# Patient Record
Sex: Female | Born: 1945 | Race: White | Hispanic: No | Marital: Married | Smoking: Never smoker
Health system: Southern US, Community
[De-identification: ages and names within clinical notes are randomized; demographics above are authoritative.]

## PROBLEM LIST (undated history)

## (undated) DIAGNOSIS — Z860100 Personal history of colon polyps, unspecified: Secondary | ICD-10-CM

## (undated) DIAGNOSIS — S62609A Fracture of unspecified phalanx of unspecified finger, initial encounter for closed fracture: Secondary | ICD-10-CM

## (undated) DIAGNOSIS — E785 Hyperlipidemia, unspecified: Secondary | ICD-10-CM

## (undated) DIAGNOSIS — H8109 Meniere's disease, unspecified ear: Secondary | ICD-10-CM

## (undated) DIAGNOSIS — J309 Allergic rhinitis, unspecified: Secondary | ICD-10-CM

## (undated) DIAGNOSIS — F419 Anxiety disorder, unspecified: Secondary | ICD-10-CM

## (undated) HISTORY — DX: Personal history of colon polyps, unspecified: Z86.0100

## (undated) HISTORY — DX: Anxiety disorder, unspecified: F41.9

## (undated) HISTORY — DX: Allergic rhinitis, unspecified: J30.9

## (undated) HISTORY — DX: Personal history of colonic polyps: Z86.010

## (undated) HISTORY — DX: Meniere's disease, unspecified ear: H81.09

## (undated) HISTORY — DX: Hyperlipidemia, unspecified: E78.5

---
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
## 1998-07-26 ENCOUNTER — Other Ambulatory Visit: Admission: RE | Admit: 1998-07-26 | Discharge: 1998-07-26 | Payer: Self-pay | Admitting: Obstetrics & Gynecology

## 1999-08-02 ENCOUNTER — Other Ambulatory Visit: Admission: RE | Admit: 1999-08-02 | Discharge: 1999-08-02 | Payer: Self-pay | Admitting: Obstetrics & Gynecology

## 1999-12-25 ENCOUNTER — Encounter: Payer: Self-pay | Admitting: Obstetrics & Gynecology

## 1999-12-25 ENCOUNTER — Encounter: Admission: RE | Admit: 1999-12-25 | Discharge: 1999-12-25 | Payer: Self-pay | Admitting: Obstetrics & Gynecology

## 2000-07-08 ENCOUNTER — Encounter: Payer: Self-pay | Admitting: Obstetrics & Gynecology

## 2000-07-08 ENCOUNTER — Encounter: Admission: RE | Admit: 2000-07-08 | Discharge: 2000-07-08 | Payer: Self-pay | Admitting: Obstetrics & Gynecology

## 2000-08-22 ENCOUNTER — Other Ambulatory Visit: Admission: RE | Admit: 2000-08-22 | Discharge: 2000-08-22 | Payer: Self-pay | Admitting: Obstetrics & Gynecology

## 2001-07-09 ENCOUNTER — Encounter: Admission: RE | Admit: 2001-07-09 | Discharge: 2001-07-09 | Payer: Self-pay | Admitting: Obstetrics & Gynecology

## 2001-07-09 ENCOUNTER — Encounter: Payer: Self-pay | Admitting: Obstetrics & Gynecology

## 2001-10-03 ENCOUNTER — Other Ambulatory Visit: Admission: RE | Admit: 2001-10-03 | Discharge: 2001-10-03 | Payer: Self-pay | Admitting: Obstetrics & Gynecology

## 2002-10-30 ENCOUNTER — Encounter: Payer: Self-pay | Admitting: Obstetrics & Gynecology

## 2002-10-30 ENCOUNTER — Encounter: Admission: RE | Admit: 2002-10-30 | Discharge: 2002-10-30 | Payer: Self-pay | Admitting: Obstetrics & Gynecology

## 2002-11-19 ENCOUNTER — Other Ambulatory Visit: Admission: RE | Admit: 2002-11-19 | Discharge: 2002-11-19 | Payer: Self-pay | Admitting: Obstetrics & Gynecology

## 2003-12-22 ENCOUNTER — Encounter: Admission: RE | Admit: 2003-12-22 | Discharge: 2003-12-22 | Payer: Self-pay | Admitting: Obstetrics & Gynecology

## 2004-01-18 ENCOUNTER — Other Ambulatory Visit: Admission: RE | Admit: 2004-01-18 | Discharge: 2004-01-18 | Payer: Self-pay | Admitting: Obstetrics & Gynecology

## 2005-01-25 ENCOUNTER — Encounter: Admission: RE | Admit: 2005-01-25 | Discharge: 2005-01-25 | Payer: Self-pay | Admitting: Obstetrics & Gynecology

## 2005-01-31 ENCOUNTER — Other Ambulatory Visit: Admission: RE | Admit: 2005-01-31 | Discharge: 2005-01-31 | Payer: Self-pay | Admitting: Obstetrics & Gynecology

## 2006-02-05 ENCOUNTER — Encounter: Admission: RE | Admit: 2006-02-05 | Discharge: 2006-02-05 | Payer: Self-pay | Admitting: Obstetrics & Gynecology

## 2006-02-13 ENCOUNTER — Other Ambulatory Visit: Admission: RE | Admit: 2006-02-13 | Discharge: 2006-02-13 | Payer: Self-pay | Admitting: Obstetrics & Gynecology

## 2007-02-10 ENCOUNTER — Encounter: Admission: RE | Admit: 2007-02-10 | Discharge: 2007-02-10 | Payer: Self-pay | Admitting: Obstetrics & Gynecology

## 2008-02-11 ENCOUNTER — Encounter: Admission: RE | Admit: 2008-02-11 | Discharge: 2008-02-11 | Payer: Self-pay | Admitting: Obstetrics & Gynecology

## 2009-02-11 ENCOUNTER — Encounter: Admission: RE | Admit: 2009-02-11 | Discharge: 2009-02-11 | Payer: Self-pay | Admitting: Obstetrics & Gynecology

## 2009-03-21 ENCOUNTER — Ambulatory Visit: Payer: Self-pay | Admitting: Internal Medicine

## 2009-03-21 DIAGNOSIS — Z9189 Other specified personal risk factors, not elsewhere classified: Secondary | ICD-10-CM | POA: Insufficient documentation

## 2009-03-21 DIAGNOSIS — Z860100 Personal history of colon polyps, unspecified: Secondary | ICD-10-CM | POA: Insufficient documentation

## 2009-03-21 DIAGNOSIS — E785 Hyperlipidemia, unspecified: Secondary | ICD-10-CM | POA: Insufficient documentation

## 2009-03-21 DIAGNOSIS — H8109 Meniere's disease, unspecified ear: Secondary | ICD-10-CM | POA: Insufficient documentation

## 2009-03-21 DIAGNOSIS — J309 Allergic rhinitis, unspecified: Secondary | ICD-10-CM | POA: Insufficient documentation

## 2009-03-21 LAB — CONVERTED CEMR LAB
Albumin: 3.7 g/dL (ref 3.5–5.2)
Cholesterol: 173 mg/dL (ref 0–200)
HDL: 66.4 mg/dL (ref >39.00)
LDL Cholesterol: 94 mg/dL (ref 0–99)
Total CHOL/HDL Ratio: 3
Triglycerides: 64 mg/dL (ref 0.0–149.0)
VLDL: 12.8 mg/dL (ref 0.0–40.0)

## 2009-09-26 ENCOUNTER — Encounter: Payer: Self-pay | Admitting: Internal Medicine

## 2010-02-17 ENCOUNTER — Ambulatory Visit: Payer: Self-pay | Admitting: Internal Medicine

## 2010-02-19 LAB — CONVERTED CEMR LAB
ALT: 24 units/L (ref 0–35)
Cholesterol: 176 mg/dL (ref 0–200)
Total Bilirubin: 1.3 mg/dL — ABNORMAL HIGH (ref 0.3–1.2)
Total CHOL/HDL Ratio: 2
Triglycerides: 67 mg/dL (ref 0.0–149.0)

## 2010-02-22 ENCOUNTER — Ambulatory Visit: Payer: Self-pay | Admitting: Internal Medicine

## 2010-02-28 ENCOUNTER — Encounter: Admission: RE | Admit: 2010-02-28 | Discharge: 2010-02-28 | Payer: Self-pay | Admitting: Obstetrics & Gynecology

## 2010-10-05 ENCOUNTER — Encounter: Payer: Self-pay | Admitting: Internal Medicine

## 2011-01-09 NOTE — Miscellaneous (Signed)
Summary: Flu Vaccination/[facility]  Flu Vaccination/[facility]   Imported By: Sherian Rein 10/12/2010 11:06:17  _____________________________________________________________________  External Attachment:    Type:   Image     Comment:   External Document

## 2011-01-09 NOTE — Assessment & Plan Note (Signed)
Summary: Jasmine Pham   Vital Signs:  Patient profile:   65 year old female Height:      5.1 inches (12.95 cm) Weight:      108.8 pounds (49.45 kg) BMI:     2951.61 O2 Sat:      97 % on Room air Temp:     98.1 degrees F (36.72 degrees C) oral Pulse rate:   58 / minute BP sitting:   110 / 58  (left arm) Cuff size:   regular  Vitals Entered By: Orlan Leavens (February 22, 2010 11:24 AM)  O2 Flow:  Room air CC: follow-up visit/ need refill on crestor Is Patient Diabetic? No Pain Assessment Patient in pain? no        Primary Care Provider:  Newt Lukes MD  CC:  follow-up visit/ need refill on crestor.  History of Present Illness: here for followup  1) dyslipidemia - reports compliance with ongoing medical treatment and no changes in medication dose or frequency. denies adverse side effects related to current therapy.     Clinical Review Panels:  Prevention   Last Mammogram:  normal (02/11/2009)   Last Pap Smear:  normal (03/26/2008)   Last Colonoscopy:  normal (12/11/2007)  Lipid Management   Cholesterol:  176 (02/17/2010)   LDL (bad choesterol):  88 (02/17/2010)   HDL (good cholesterol):  74.20 (02/17/2010)  Complete Metabolic Panel   Albumin:  3.7 (02/17/2010)   Total Protein:  6.1 (02/17/2010)   Total Bili:  1.3 (02/17/2010)   Alk Phos:  70 (02/17/2010)   SGPT (ALT):  24 (02/17/2010)   SGOT (AST):  32 (02/17/2010)   Current Medications (verified): 1)  Crestor 5 Mg Tabs (Rosuvastatin Calcium) .... Take 1 By Mouth Qd 2)  Evista 60 Mg Tabs (Raloxifene Hcl) .... Take 1 By Mouth Qd 3)  Chlorthalidone 25 Mg Tabs (Chlorthalidone) .... Take 1 By Mouth Qd 4)  Calcium 600/vitamin D 600-400 Mg-Unit Tabs (Calcium Carbonate-Vitamin D) .... Take 1 By Mouth Qd 5)  Potassium 99 Mg Tabs (Potassium) .... Take 1 By Mouth Qd  Allergies (verified): No Known Drug Allergies  Review of Systems  The patient denies anorexia, weight loss, and chest pain.    Physical  Exam  General:  alert, well-developed, well-nourished, and cooperative to examination.    Lungs:  normal respiratory effort, no intercostal retractions or use of accessory muscles; normal breath sounds bilaterally - no crackles and no wheezes.    Heart:  normal rate, regular rhythm, no murmur, and no rub. BLE without edema.   Impression & Recommendations:  Problem # 1:  HYPERLIPIDEMIA (ICD-272.4) cont same - tol med well -  Her updated medication list for this problem includes:    Crestor 5 Mg Tabs (Rosuvastatin calcium) .Marland Kitchen... Take 1 by mouth qd  Labs Reviewed: SGOT: 32 (02/17/2010)   SGPT: 24 (02/17/2010)   HDL:74.20 (02/17/2010), 66.40 (03/21/2009)  LDL:88 (02/17/2010), 94 (03/21/2009)  Chol:176 (02/17/2010), 173 (03/21/2009)  Trig:67.0 (02/17/2010), 64.0 (03/21/2009)  Complete Medication List: 1)  Crestor 5 Mg Tabs (Rosuvastatin calcium) .... Take 1 by mouth qd 2)  Evista 60 Mg Tabs (Raloxifene hcl) .... Take 1 by mouth qd 3)  Chlorthalidone 25 Mg Tabs (Chlorthalidone) .... Take 1 by mouth qd 4)  Calcium 600/vitamin D 600-400 Mg-unit Tabs (Calcium carbonate-vitamin d) .... Take 1 by mouth qd 5)  Potassium 99 Mg Tabs (Potassium) .... Take 1 by mouth qd  Patient Instructions: 1)  it was good to see you today. 2)  labs and exam look good - continue same - no change 3)  Please schedule a follow-up appointment in 6-12 months, sooner if problems.  Prescriptions: CRESTOR 5 MG TABS (ROSUVASTATIN CALCIUM) TAKE 1 by mouth QD  #30 x 5   Entered by:   Orlan Leavens   Authorized by:   Newt Lukes MD   Signed by:   Orlan Leavens on 02/22/2010   Method used:   Electronically to        [phone] (retail)       [address] [place], [place]  [address]       Ph: [phone] or [phone]       Fax: [phone]   RxID:   [id number]

## 2011-02-12 ENCOUNTER — Other Ambulatory Visit: Payer: Self-pay | Admitting: Obstetrics & Gynecology

## 2011-02-12 DIAGNOSIS — Z1231 Encounter for screening mammogram for malignant neoplasm of breast: Secondary | ICD-10-CM

## 2011-03-07 ENCOUNTER — Ambulatory Visit
Admission: RE | Admit: 2011-03-07 | Discharge: 2011-03-07 | Disposition: A | Discharge status: Home / Self Care | Payer: BC Managed Care – PPO | Source: Ambulatory Visit | Attending: Obstetrics & Gynecology | Admitting: Obstetrics & Gynecology

## 2011-03-07 DIAGNOSIS — Z1231 Encounter for screening mammogram for malignant neoplasm of breast: Secondary | ICD-10-CM

## 2011-03-28 ENCOUNTER — Other Ambulatory Visit (INDEPENDENT_AMBULATORY_CARE_PROVIDER_SITE_OTHER): Payer: BC Managed Care – PPO | Admitting: Internal Medicine

## 2011-03-28 ENCOUNTER — Other Ambulatory Visit (INDEPENDENT_AMBULATORY_CARE_PROVIDER_SITE_OTHER): Payer: BC Managed Care – PPO

## 2011-03-28 ENCOUNTER — Encounter: Payer: Self-pay | Admitting: Internal Medicine

## 2011-03-28 ENCOUNTER — Other Ambulatory Visit: Payer: Self-pay | Admitting: Internal Medicine

## 2011-03-28 DIAGNOSIS — Z0000 Encounter for general adult medical examination without abnormal findings: Secondary | ICD-10-CM

## 2011-03-28 DIAGNOSIS — E785 Hyperlipidemia, unspecified: Secondary | ICD-10-CM

## 2011-03-28 LAB — CBC WITH DIFFERENTIAL/PLATELET
Basophils Absolute: 0 10*3/uL (ref 0.0–0.1)
Basophils Relative: 0.3 % (ref 0.0–3.0)
Eosinophils Absolute: 0 10*3/uL (ref 0.0–0.7)
Eosinophils Relative: 0.1 % (ref 0.0–5.0)
Lymphocytes Relative: 25 % (ref 12.0–46.0)
Lymphs Abs: 1.2 10*3/uL (ref 0.7–4.0)
MCHC: 35.4 g/dL (ref 30.0–36.0)
Neutro Abs: 3.3 10*3/uL (ref 1.4–7.7)
RBC: 4.42 Mil/uL (ref 3.87–5.11)
RDW: 12.9 % (ref 11.5–14.6)
WBC: 4.8 10*3/uL (ref 4.5–10.5)

## 2011-03-28 LAB — URINALYSIS, ROUTINE W REFLEX MICROSCOPIC
Bilirubin Urine: NEGATIVE
Nitrite: NEGATIVE
Specific Gravity, Urine: 1.01 (ref 1.000–1.030)
Total Protein, Urine: NEGATIVE
Urine Glucose: NEGATIVE
Urobilinogen, UA: 0.2 (ref 0.0–1.0)
pH: 6.5 (ref 5.0–8.0)

## 2011-03-28 LAB — LIPID PANEL
HDL: 81 mg/dL (ref >39.00)
Total CHOL/HDL Ratio: 3
VLDL: 9.8 mg/dL (ref 0.0–40.0)

## 2011-03-28 LAB — HEPATIC FUNCTION PANEL: AST: 27 U/L (ref 0–37)

## 2011-03-28 LAB — BASIC METABOLIC PANEL
BUN: 13 mg/dL (ref 6–23)
CO2: 33 mEq/L — ABNORMAL HIGH (ref 19–32)
Calcium: 9.2 mg/dL (ref 8.4–10.5)
Chloride: 103 mEq/L (ref 96–112)
Sodium: 142 mEq/L (ref 135–145)

## 2011-03-28 LAB — LDL CHOLESTEROL, DIRECT: Direct LDL: 103.8 mg/dL

## 2011-04-04 ENCOUNTER — Encounter: Payer: Self-pay | Admitting: Internal Medicine

## 2011-04-04 ENCOUNTER — Ambulatory Visit (INDEPENDENT_AMBULATORY_CARE_PROVIDER_SITE_OTHER): Payer: BC Managed Care – PPO | Admitting: Internal Medicine

## 2011-04-04 VITALS — BP 132/70 | HR 78 | Temp 98.8°F | Ht 61.0 in | Wt 105.1 lb

## 2011-04-04 DIAGNOSIS — G2581 Restless legs syndrome: Secondary | ICD-10-CM

## 2011-04-04 DIAGNOSIS — Z0000 Encounter for general adult medical examination without abnormal findings: Secondary | ICD-10-CM

## 2011-04-04 DIAGNOSIS — E785 Hyperlipidemia, unspecified: Secondary | ICD-10-CM

## 2011-04-04 MED ORDER — FERROUS SULFATE 325 (65 FE) MG PO TABS: 325.0000 mg | ORAL_TABLET | Freq: Every day | ORAL | Status: DC

## 2011-04-04 NOTE — Progress Notes (Signed)
Subjective:    Patient ID: Jasmine Pham, female    DOB: [birth year], 65 y.o.   MRN: [id number]  HPI patient is here today for annual physical. Patient feels well and has no complaints.  Also reviewed chronic medical issues: Dyslipidemia - the patient reports compliance with medication(s) as prescribed. Denies adverse side effects. RLS - progressive symptoms - ?med options for same Allergic rhinitis - takes otc meds as needed - symptoms worse at night and spring season  Past Medical History  Diagnosis Date  . UNSPECIFIED MENIERES DISEASE   . COLONIC POLYPS, HX OF   . HYPERLIPIDEMIA   . ALLERGIC RHINITIS    Family History  Problem Relation Age of Onset  . Prostate cancer Father   . Colon cancer Other   . Hyperlipidemia Other   . Kidney disease Other    History  Substance Use Topics  . Smoking status: Never Smoker   . Smokeless tobacco: Not on file  . Alcohol Use: No    Review of Systems  Constitutional: Negative for fever.  Respiratory: Negative for cough and shortness of breath.   Cardiovascular: Negative for chest pain.  Gastrointestinal: Negative for abdominal pain.  Musculoskeletal: Negative for gait problem.  Skin: Negative for rash.  Neurological: Negative for dizziness.  No other specific complaints in a complete review of systems (except as listed in HPI above).     Objective:   Physical Exam BP 132/70  Pulse 78  Temp(Src) 98.8 F (37.1 C) (Oral)  Ht 5\' 1"  (1.549 m)  Wt 105 lb 1.9 oz (47.682 kg)  BMI 19.86 kg/m2  SpO2 97% Physical Exam  Constitutional: She is oriented to person, place, and time. She appears well-developed and well-nourished. No distress.  HENT:Head: Normocephalic and atraumatic. Ears - B TMs clear without effusion or erythema. Nose: Nose normal.  Mouth/Throat: Oropharynx is clear and moist. No oropharyngeal exudate.  Eyes: Conjunctivae and EOM are normal. Pupils are equal, round, and reactive to light. No scleral icterus.  Neck:  Normal range of motion. Neck supple. No JVD present. No thyromegaly present.  Cardiovascular: Normal rate, regular rhythm and normal heart sounds.  No murmur heard. Pulmonary/Chest: Effort normal and breath sounds normal. No respiratory distress. She has no wheezes.  Abdominal: Soft. Bowel sounds are normal. She exhibits no distension. There is no tenderness.  Musculoskeletal: Normal range of motion. She exhibits no edema.  Neurological: She is alert and oriented to person, place, and time. No cranial nerve deficit. Coordination normal.  Skin: Skin is warm and dry. No rash noted. No erythema.  Psychiatric: She has a normal mood and affect. Her behavior is normal. Judgment and thought content normal.  GU - defer to gynecology     Lab Results  Component Value Date   WBC 4.8 03/28/2011   HGB 14.2 03/28/2011   HCT 40.2 03/28/2011   PLT 236.0 03/28/2011   CHOL 204* 03/28/2011   TRIG 49.0 03/28/2011   HDL 81.00 03/28/2011   LDLDIRECT 103.8 03/28/2011   ALT 17 03/28/2011   AST 27 03/28/2011   NA 142 03/28/2011   K 4.2 03/28/2011   CL 103 03/28/2011   CREATININE 0.7 03/28/2011   BUN 13 03/28/2011   CO2 33* 03/28/2011   TSH 2.37 03/28/2011    Assessment & Plan:   CPX - Patient has been counseled on age-appropriate routine health concerns for screening and prevention. These are reviewed and up-to-date. Immunizations are up-to-date or declined. Labs and ECG reviewed (NSR, no ischemia; no  arrythmia)

## 2011-04-04 NOTE — Patient Instructions (Signed)
It was good to see you today. Labs, exam look good today - keep up the good work! Try iron for restless legs, avoid caffiene and alcohol before bedtime - call if symptoms persist and will try sinemet if needed Refill on medication(s) as discussed today. Please schedule followup in 12 months for labs and physical, call sooner if problems.

## 2011-04-04 NOTE — Assessment & Plan Note (Signed)
Pt will try 30days iron supplement for same - if unimproved, consider sinemet (pt will call if rx needed)

## 2011-04-04 NOTE — Assessment & Plan Note (Signed)
On statin. The current medical regimen is effective;  continue present plan and medications. Refills today

## 2011-08-05 ENCOUNTER — Other Ambulatory Visit: Payer: Self-pay | Admitting: Internal Medicine

## 2011-10-05 ENCOUNTER — Encounter: Payer: Self-pay | Admitting: Internal Medicine

## 2012-01-30 ENCOUNTER — Other Ambulatory Visit: Payer: Self-pay | Admitting: Internal Medicine

## 2012-01-30 DIAGNOSIS — Z1231 Encounter for screening mammogram for malignant neoplasm of breast: Secondary | ICD-10-CM

## 2012-03-11 ENCOUNTER — Ambulatory Visit: Payer: BC Managed Care – PPO

## 2012-03-18 ENCOUNTER — Ambulatory Visit: Payer: BC Managed Care – PPO

## 2012-03-19 ENCOUNTER — Ambulatory Visit
Admission: RE | Admit: 2012-03-19 | Discharge: 2012-03-19 | Disposition: A | Discharge status: Home / Self Care | Payer: BC Managed Care – PPO | Source: Ambulatory Visit | Attending: Internal Medicine | Admitting: Internal Medicine

## 2012-03-19 DIAGNOSIS — Z1231 Encounter for screening mammogram for malignant neoplasm of breast: Secondary | ICD-10-CM

## 2012-04-16 ENCOUNTER — Telehealth: Payer: Self-pay | Admitting: *Deleted

## 2012-04-16 DIAGNOSIS — Z0000 Encounter for general adult medical examination without abnormal findings: Secondary | ICD-10-CM

## 2012-04-16 NOTE — Telephone Encounter (Signed)
Message copied by Deatra James on Wed Apr 16, 2012  4:56 PM ------      Message from: Etheleen Sia      Created: Wed Apr 16, 2012  4:21 PM      Regarding: labs       PHYSICAL LABS FOR June 19

## 2012-04-16 NOTE — Telephone Encounter (Signed)
Received staff msg made cpx for 05/18/12 need labs entered... 04/16/12@4 :57pm/LMB

## 2012-05-21 ENCOUNTER — Other Ambulatory Visit (INDEPENDENT_AMBULATORY_CARE_PROVIDER_SITE_OTHER): Payer: BC Managed Care – PPO

## 2012-05-21 DIAGNOSIS — Z0000 Encounter for general adult medical examination without abnormal findings: Secondary | ICD-10-CM

## 2012-05-21 LAB — CBC WITH DIFFERENTIAL/PLATELET
Basophils Absolute: 0 10*3/uL (ref 0.0–0.1)
MCHC: 33.7 g/dL (ref 30.0–36.0)
MCV: 92.4 fl (ref 78.0–100.0)
RBC: 4.53 Mil/uL (ref 3.87–5.11)
RDW: 13.4 % (ref 11.5–14.6)
WBC: 3.3 10*3/uL — ABNORMAL LOW (ref 4.5–10.5)

## 2012-05-21 LAB — URINALYSIS, ROUTINE W REFLEX MICROSCOPIC
Bilirubin Urine: NEGATIVE
Specific Gravity, Urine: 1.025 (ref 1.000–1.030)
Total Protein, Urine: NEGATIVE
Urine Glucose: NEGATIVE
Urobilinogen, UA: 0.2 (ref 0.0–1.0)
pH: 5.5 (ref 5.0–8.0)

## 2012-05-21 LAB — BASIC METABOLIC PANEL
CO2: 32 mEq/L (ref 19–32)
Glucose, Bld: 70 mg/dL (ref 70–99)
Potassium: 4.4 mEq/L (ref 3.5–5.1)
Sodium: 141 mEq/L (ref 135–145)

## 2012-05-21 LAB — HEPATIC FUNCTION PANEL
AST: 21 U/L (ref 0–37)
Alkaline Phosphatase: 75 U/L (ref 39–117)
Total Bilirubin: 1 mg/dL (ref 0.3–1.2)

## 2012-05-21 LAB — LIPID PANEL: HDL: 74.1 mg/dL (ref >39.00)

## 2012-05-28 ENCOUNTER — Encounter: Payer: Self-pay | Admitting: Internal Medicine

## 2012-05-28 ENCOUNTER — Ambulatory Visit (INDEPENDENT_AMBULATORY_CARE_PROVIDER_SITE_OTHER): Payer: BC Managed Care – PPO | Admitting: Internal Medicine

## 2012-05-28 VITALS — BP 120/78 | HR 56 | Temp 98.1°F | Ht 61.0 in | Wt 107.0 lb

## 2012-05-28 DIAGNOSIS — E785 Hyperlipidemia, unspecified: Secondary | ICD-10-CM

## 2012-05-28 DIAGNOSIS — T753XXA Motion sickness, initial encounter: Secondary | ICD-10-CM

## 2012-05-28 DIAGNOSIS — Z0000 Encounter for general adult medical examination without abnormal findings: Secondary | ICD-10-CM

## 2012-05-28 MED ORDER — SCOPOLAMINE 1 MG/3DAYS TD PT72: 1.0000 | MEDICATED_PATCH | TRANSDERMAL | Status: DC

## 2012-05-28 MED ORDER — ROSUVASTATIN CALCIUM 5 MG PO TABS: 5.0000 mg | ORAL_TABLET | Freq: Every day | ORAL | Status: DC

## 2012-05-28 NOTE — Assessment & Plan Note (Signed)
On statin. There have been some probable medication and dietary compliance issues here. I have discussed with her the great importance of following the treatment plan exactly as directed in order to achieve a good medical outcome. The current medical regimen is effective;  continue present plan and medications. Refills today

## 2012-05-28 NOTE — Patient Instructions (Addendum)
It was good to see you today. We have reviewed your prior records including labs and tests today Health Maintenance reviewed - all recommended immunizations and age-appropriate screenings are up-to-date. Medications reviewed, use transderm patch for motion sickness as needed - no other changes at this time. Refill on medication(s) as discussed today. Please schedule followup in 1 year for physical/labs, call sooner if problems.  Preventive Care for Adults, Female A healthy lifestyle and preventive care can promote health and wellness. Preventive health guidelines for women include the following key practices.  A routine yearly physical is a good way to check with your caregiver about your health and preventive screening. It is a chance to share any concerns and updates on your health, and to receive a thorough exam.   Visit your dentist for a routine exam and preventive care every 6 months. Brush your teeth twice a day and floss once a day. Good oral hygiene prevents tooth decay and gum disease.   The frequency of eye exams is based on your age, health, family medical history, use of contact lenses, and other factors. Follow your caregiver's recommendations for frequency of eye exams.   Eat a healthy diet. Foods like vegetables, fruits, whole grains, low-fat dairy products, and lean protein foods contain the nutrients you need without too many calories. Decrease your intake of foods high in solid fats, added sugars, and salt. Eat the right amount of calories for you. Get information about a proper diet from your caregiver, if necessary.   Regular physical exercise is one of the most important things you can do for your health. Most adults should get at least 150 minutes of moderate-intensity exercise (any activity that increases your heart rate and causes you to sweat) each week. In addition, most adults need muscle-strengthening exercises on 2 or more days a week.   Maintain a healthy weight.  The body mass index (BMI) is a screening tool to identify possible weight problems. It provides an estimate of body fat based on height and weight. Your caregiver can help determine your BMI, and can help you achieve or maintain a healthy weight. For adults 20 years and older:   A BMI below 18.5 is considered underweight.   A BMI of 18.5 to 24.9 is normal.   A BMI of 25 to 29.9 is considered overweight.   A BMI of 30 and above is considered obese.   Maintain normal blood lipids and cholesterol levels by exercising and minimizing your intake of saturated fat. Eat a balanced diet with plenty of fruit and vegetables. Blood tests for lipids and cholesterol should begin at age 17 and be repeated every 5 years. If your lipid or cholesterol levels are high, you are over 50, or you are at high risk for heart disease, you may need your cholesterol levels checked more frequently. Ongoing high lipid and cholesterol levels should be treated with medicines if diet and exercise are not effective.   If you smoke, find out from your caregiver how to quit. If you do not use tobacco, do not start.   If you are pregnant, do not drink alcohol. If you are breastfeeding, be very cautious about drinking alcohol. If you are not pregnant and choose to drink alcohol, do not exceed 1 drink per day. One drink is considered to be 12 ounces (355 mL) of beer, 5 ounces (148 mL) of wine, or 1.5 ounces (44 mL) of liquor.   Avoid use of street drugs. Do not share needles  with anyone. Ask for help if you need support or instructions about stopping the use of drugs.   High blood pressure causes heart disease and increases the risk of stroke. Your blood pressure should be checked at least every 1 to 2 years. Ongoing high blood pressure should be treated with medicines if weight loss and exercise are not effective.   If you are 76 to 64 years old, ask your caregiver if you should take aspirin to prevent strokes.   Diabetes screening  involves taking a blood sample to check your fasting blood sugar level. This should be done once every 3 years, after age 28, if you are within normal weight and without risk factors for diabetes. Testing should be considered at a younger age or be carried out more frequently if you are overweight and have at least 1 risk factor for diabetes.   Breast cancer screening is essential preventive care for women. You should practice "breast self-awareness." This means understanding the normal appearance and feel of your breasts and may include breast self-examination. Any changes detected, no matter how small, should be reported to a caregiver. Women in their 37s and 30s should have a clinical breast exam (CBE) by a caregiver as part of a regular health exam every 1 to 3 years. After age 49, women should have a CBE every year. Starting at age 19, women should consider having a mammography (breast X-ray test) every year. Women who have a family history of breast cancer should talk to their caregiver about genetic screening. Women at a high risk of breast cancer should talk to their caregivers about having magnetic resonance imaging (MRI) and a mammography every year.   The Pap test is a screening test for cervical cancer. A Pap test can show cell changes on the cervix that might become cervical cancer if left untreated. A Pap test is a procedure in which cells are obtained and examined from the lower end of the uterus (cervix).   Women should have a Pap test starting at age 63.   Between ages 60 and 42, Pap tests should be repeated every 2 years.   Beginning at age 53, you should have a Pap test every 3 years as long as the past 3 Pap tests have been normal.   Some women have medical problems that increase the chance of getting cervical cancer. Talk to your caregiver about these problems. It is especially important to talk to your caregiver if a new problem develops soon after your last Pap test. In these  cases, your caregiver may recommend more frequent screening and Pap tests.   The above recommendations are the same for women who have or have not gotten the vaccine for human papillomavirus (HPV).   If you had a hysterectomy for a problem that was not cancer or a condition that could lead to cancer, then you no longer need Pap tests. Even if you no longer need a Pap test, a regular exam is a good idea to make sure no other problems are starting.   If you are between ages 17 and 104, and you have had normal Pap tests going back 10 years, you no longer need Pap tests. Even if you no longer need a Pap test, a regular exam is a good idea to make sure no other problems are starting.   If you have had past treatment for cervical cancer or a condition that could lead to cancer, you need Pap tests and screening for  cancer for at least 20 years after your treatment.   If Pap tests have been discontinued, risk factors (such as a new sexual partner) need to be reassessed to determine if screening should be resumed.   The HPV test is an additional test that may be used for cervical cancer screening. The HPV test looks for the virus that can cause the cell changes on the cervix. The cells collected during the Pap test can be tested for HPV. The HPV test could be used to screen women aged 70 years and older, and should be used in women of any age who have unclear Pap test results. After the age of 28, women should have HPV testing at the same frequency as a Pap test.   Colorectal cancer can be detected and often prevented. Most routine colorectal cancer screening begins at the age of 75 and continues through age 10. However, your caregiver may recommend screening at an earlier age if you have risk factors for colon cancer. On a yearly basis, your caregiver may provide home test kits to check for hidden blood in the stool. Use of a small camera at the end of a tube, to directly examine the colon (sigmoidoscopy or  colonoscopy), can detect the earliest forms of colorectal cancer. Talk to your caregiver about this at age 55, when routine screening begins.  Direct examination of the colon should be repeated every 5 to 10 years through age 59, unless early forms of pre-cancerous polyps or small growths are found.   Hepatitis C blood testing is recommended for all people born from [birth year] through 1965 and any individual with known risks for hepatitis C.   Practice safe sex. Use condoms and avoid high-risk sexual practices to reduce the spread of sexually transmitted infections (STIs). STIs include gonorrhea, chlamydia, syphilis, trichomonas, herpes, HPV, and human immunodeficiency virus (HIV). Herpes, HIV, and HPV are viral illnesses that have no cure. They can result in disability, cancer, and death. Sexually active women aged 38 and younger should be checked for chlamydia. Older women with new or multiple partners should also be tested for chlamydia. Testing for other STIs is recommended if you are sexually active and at increased risk.   Osteoporosis is a disease in which the bones lose minerals and strength with aging. This can result in serious bone fractures. The risk of osteoporosis can be identified using a bone density scan. Women ages 78 and over and women at risk for fractures or osteoporosis should discuss screening with their caregivers. Ask your caregiver whether you should take a calcium supplement or vitamin D to reduce the rate of osteoporosis.   Menopause can be associated with physical symptoms and risks. Hormone replacement therapy is available to decrease symptoms and risks. You should talk to your caregiver about whether hormone replacement therapy is right for you.   Use sunscreen with sun protection factor (SPF) of 30 or more. Apply sunscreen liberally and repeatedly throughout the day. You should seek shade when your shadow is shorter than you. Protect yourself by wearing long sleeves, pants, a  wide-brimmed hat, and sunglasses year round, whenever you are outdoors.   Once a month, do a whole body skin exam, using a mirror to look at the skin on your back. Notify your caregiver of new moles, moles that have irregular borders, moles that are larger than a pencil eraser, or moles that have changed in shape or color.   Stay current with required immunizations.   Influenza. You need  a dose every fall (or winter). The composition of the flu vaccine changes each year, so being vaccinated once is not enough.   Pneumococcal polysaccharide. You need 1 to 2 doses if you smoke cigarettes or if you have certain chronic medical conditions. You need 1 dose at age 77 (or older) if you have never been vaccinated.   Tetanus, diphtheria, pertussis (Tdap, Td). Get 1 dose of Tdap vaccine if you are younger than age 44, are over 80 and have contact with an infant, are a Research scientist (physical sciences), are pregnant, or simply want to be protected from whooping cough. After that, you need a Td booster dose every 10 years. Consult your caregiver if you have not had at least 3 tetanus and diphtheria-containing shots sometime in your life or have a deep or dirty wound.   HPV. You need this vaccine if you are a woman age 66 or younger. The vaccine is given in 3 doses over 6 months.   Measles, mumps, rubella (MMR). You need at least 1 dose of MMR if you were born in 1957 or later. You may also need a second dose.   Meningococcal. If you are age 40 to 28 and a first-year college student living in a residence hall, or have one of several medical conditions, you need to get vaccinated against meningococcal disease. You may also need additional booster doses.   Zoster (shingles). If you are age 71 or older, you should get this vaccine.   Varicella (chickenpox). If you have never had chickenpox or you were vaccinated but received only 1 dose, talk to your caregiver to find out if you need this vaccine.   Hepatitis A. You need this  vaccine if you have a specific risk factor for hepatitis A virus infection or you simply wish to be protected from this disease. The vaccine is usually given as 2 doses, 6 to 18 months apart.   Hepatitis B. You need this vaccine if you have a specific risk factor for hepatitis B virus infection or you simply wish to be protected from this disease. The vaccine is given in 3 doses, usually over 6 months.  Preventive Services / Frequency Ages 50 to 87  Blood pressure check.** / Every 1 to 2 years.   Lipid and cholesterol check.** / Every 5 years beginning at age 46.   Clinical breast exam.** / Every 3 years for women in their 61s and 30s.   Pap test.** / Every 2 years from ages 57 through 65. Every 3 years starting at age 33 through age 71 or 66 with a history of 3 consecutive normal Pap tests.   HPV screening.** / Every 3 years from ages 58 through ages 60 to 98 with a history of 3 consecutive normal Pap tests.   Hepatitis C blood test.** / For any individual with known risks for hepatitis C.   Skin self-exam. / Monthly.   Influenza immunization.** / Every year.   Pneumococcal polysaccharide immunization.** / 1 to 2 doses if you smoke cigarettes or if you have certain chronic medical conditions.   Tetanus, diphtheria, pertussis (Tdap, Td) immunization. / A one-time dose of Tdap vaccine. After that, you need a Td booster dose every 10 years.   HPV immunization. / 3 doses over 6 months, if you are 24 and younger.   Measles, mumps, rubella (MMR) immunization. / You need at least 1 dose of MMR if you were born in 1957 or later. You may also need a second  dose.   Meningococcal immunization. / 1 dose if you are age 20 to 68 and a first-year college student living in a residence hall, or have one of several medical conditions, you need to get vaccinated against meningococcal disease. You may also need additional booster doses.   Varicella immunization.** / Consult your caregiver.   Hepatitis  A immunization.** / Consult your caregiver. 2 doses, 6 to 18 months apart.   Hepatitis B immunization.** / Consult your caregiver. 3 doses usually over 6 months.  Ages 91 to 1  Blood pressure check.** / Every 1 to 2 years.   Lipid and cholesterol check.** / Every 5 years beginning at age 31.   Clinical breast exam.** / Every year after age 76.   Mammogram.** / Every year beginning at age 33 and continuing for as long as you are in good health. Consult with your caregiver.   Pap test.** / Every 3 years starting at age 58 through age 51 or 59 with a history of 3 consecutive normal Pap tests.   HPV screening.** / Every 3 years from ages 10 through ages 22 to 51 with a history of 3 consecutive normal Pap tests.   Fecal occult blood test (FOBT) of stool. / Every year beginning at age 12 and continuing until age 70. You may not need to do this test if you get a colonoscopy every 10 years.   Flexible sigmoidoscopy or colonoscopy.** / Every 5 years for a flexible sigmoidoscopy or every 10 years for a colonoscopy beginning at age 32 and continuing until age 76.   Hepatitis C blood test.** / For all people born from [birth year] through 1963 and any individual with known risks for hepatitis C.   Skin self-exam. / Monthly.   Influenza immunization.** / Every year.   Pneumococcal polysaccharide immunization.** / 1 to 2 doses if you smoke cigarettes or if you have certain chronic medical conditions.   Tetanus, diphtheria, pertussis (Tdap, Td) immunization.** / A one-time dose of Tdap vaccine. After that, you need a Td booster dose every 10 years.   Measles, mumps, rubella (MMR) immunization. / You need at least 1 dose of MMR if you were born in 1957 or later. You may also need a second dose.   Varicella immunization.** / Consult your caregiver.   Meningococcal immunization.** / Consult your caregiver.   Hepatitis A immunization.** / Consult your caregiver. 2 doses, 6 to 18 months apart.    Hepatitis B immunization.** / Consult your caregiver. 3 doses, usually over 6 months.  Ages 68 and over  Blood pressure check.** / Every 1 to 2 years.   Lipid and cholesterol check.** / Every 5 years beginning at age 47.   Clinical breast exam.** / Every year after age 67.   Mammogram.** / Every year beginning at age 5 and continuing for as long as you are in good health. Consult with your caregiver.   Pap test.** / Every 3 years starting at age 63 through age 43 or 16 with a 3 consecutive normal Pap tests. Testing can be stopped between 65 and 70 with 3 consecutive normal Pap tests and no abnormal Pap or HPV tests in the past 10 years.   HPV screening.** / Every 3 years from ages 72 through ages 56 or 38 with a history of 3 consecutive normal Pap tests. Testing can be stopped between 65 and 70 with 3 consecutive normal Pap tests and no abnormal Pap or HPV tests in the past 10 years.  Fecal occult blood test (FOBT) of stool. / Every year beginning at age 19 and continuing until age 20. You may not need to do this test if you get a colonoscopy every 10 years.   Flexible sigmoidoscopy or colonoscopy.** / Every 5 years for a flexible sigmoidoscopy or every 10 years for a colonoscopy beginning at age 70 and continuing until age 8.   Hepatitis C blood test.** / For all people born from [birth year] through 1963 and any individual with known risks for hepatitis C.   Osteoporosis screening.** / A one-time screening for women ages 49 and over and women at risk for fractures or osteoporosis.   Skin self-exam. / Monthly.   Influenza immunization.** / Every year.   Pneumococcal polysaccharide immunization.** / 1 dose at age 51 (or older) if you have never been vaccinated.   Tetanus, diphtheria, pertussis (Tdap, Td) immunization. / A one-time dose of Tdap vaccine if you are over 65 and have contact with an infant, are a Research scientist (physical sciences), or simply want to be protected from whooping cough. After  that, you need a Td booster dose every 10 years.   Varicella immunization.** / Consult your caregiver.   Meningococcal immunization.** / Consult your caregiver.   Hepatitis A immunization.** / Consult your caregiver. 2 doses, 6 to 18 months apart.   Hepatitis B immunization.** / Check with your caregiver. 3 doses, usually over 6 months.  ** Family history and personal history of risk and conditions may change your caregiver's recommendations. Document Released: 01/22/2002 Document Revised: 11/15/2011 Document Reviewed: 04/23/2011 [facility] Patient Information 2012 [place], [place].Motion Sickness Motion sickness is an unpleasant, temporary feeling of dizziness, nausea, and vomiting that occurs when a person is traveling. It can occur during travel by boat, car, airplane, or even on an amusement park ride. The symptoms of motion sickness usually get better once the motion or traveling stops, but problems may persist for hours or days. CAUSES   Motion of the body can cause fluid changes in your inner ear, which can result in motion sickness. Some people are more susceptible to motion sickness than others. Stress, other illnesses, or drinking too much alcohol may add to motion sickness. SYMPTOMS    Nausea.   Dizziness.   Unsteadiness when walking.   Vomiting.  DIAGNOSIS   Motion sickness is diagnosed based on your symptoms while you are traveling or moving. TREATMENT   Most people improve rapidly after the motion stops. There are also over-the-counter medicines that can help with motion sickness. Your caregiver can prescribe motion sickness patches. These patches are placed behind your ear. PREVENTION    Avoid situations that cause your motion sickness, if possible.   Consider taking medicines such as meclizine or dimenhydrinate before going on a trip that may cause motion sickness.   Do not eat large meals or drink alcohol before or during travel.   Take small, frequent sips of  liquids as needed.   Sit in an area of the airplane or boat with the least motion. On an airplane, sit near the wing. Lie back in your seat, if possible. When riding in a car, try to avoid sitting in the backseat.   Breathe slowly and deeply.   Do not read or focus on nearby objects, if possible, especially if the water or air is rough. However, watching the horizon or a distant object is sometimes helpful, especially in a boat.   Avoid areas where people are smoking, if possible.   Plan ahead for  vacations. Your caregiver can help you with a prescription or suggestions for over-the-counter medicines.  HOME CARE INSTRUCTIONS    Only take over-the-counter or prescription medicines as directed by your caregiver.   If you use a motion sickness patch, wash your hands after you put the patch on. Touching your hands to your eyes after using the patch can enlarge (dilate) your pupils for 1 to 2 days and disturb your vision.  SEEK MEDICAL CARE IF:    Your vomiting or nausea cannot be controlled with medicine and rest.   You notice blood in your vomit. This could be dark red or look like coffee grounds.   You faint or have severe dizziness or lightheadedness upon standing. These may be signs of dehydration.   You have a fever.   You have severe abdominal or chest pain.   You have trouble breathing.   You have a severe headache.   You develop weakness or numbness on one side of the body.   You have trouble speaking.  MAKE SURE YOU:    Understand these instructions.   Will watch your condition.   Will get help right away if you are not doing well or get worse.  Document Released: 11/26/2005 Document Revised: 11/15/2011 Document Reviewed: 06/26/2011 [facility] Patient Information 2012 [place], [place].

## 2012-05-28 NOTE — Progress Notes (Signed)
Subjective:    Patient ID: Jasmine Pham, female    DOB: [birth year], 66 y.o.   MRN: [id number]  HPI  patient is here today for annual physical. Patient feels well overall.  Also reviewed chronic medical issues: Dyslipidemia - the patient reports variable compliance with medication(s) as prescribed. Denies adverse side effects.  Allergic rhinitis - takes otc meds as needed - symptoms worse at night and spring season ? Med for motion sickness - hx same,planning whale watching boat trip next month   Past Medical History  Diagnosis Date  . UNSPECIFIED MENIERES DISEASE   . COLONIC POLYPS, HX OF   . HYPERLIPIDEMIA   . ALLERGIC RHINITIS   . Anxiety    Family History  Problem Relation Age of Onset  . Prostate cancer Father   . Colon cancer Other   . Hyperlipidemia Other   . Kidney disease Other    History  Substance Use Topics  . Smoking status: Never Smoker   . Smokeless tobacco: Not on file  . Alcohol Use: No    Review of Systems Constitutional: Negative for fever.  Respiratory: Negative for cough and shortness of breath.   Cardiovascular: Negative for chest pain.  Gastrointestinal: Negative for abdominal pain.  Musculoskeletal: Negative for gait problem.  Skin: Negative for rash.  Neurological: Negative for dizziness.  No other specific complaints in a complete review of systems (except as listed in HPI above).     Objective:   Physical Exam  BP 120/78  Pulse 56  Temp 98.1 F (36.7 C) (Oral)  Ht 5\' 1"  (1.549 m)  Wt 107 lb (48.535 kg)  BMI 20.22 kg/m2  SpO2 97%  Wt Readings from Last 3 Encounters:  05/28/12 107 lb (48.535 kg)  04/04/11 105 lb 1.9 oz (47.682 kg)  02/22/10 108 lb 12.8 oz (49.351 kg)   Constitutional: She appears well-developed and well-nourished. No distress.  HENT:Head: Normocephalic and atraumatic. Ears - B TMs clear without effusion or erythema. Nose: Nose normal. Mouth/Throat: Oropharynx is clear and moist. No oropharyngeal exudate.    Eyes: Conjunctivae and EOM are normal. Pupils are equal, round, and reactive to light. No scleral icterus.  Neck: Normal range of motion. Neck supple. No JVD present. No thyromegaly present.  Cardiovascular: Normal rate, regular rhythm and normal heart sounds.  No murmur heard. no BLE edema Pulmonary/Chest: Effort normal and breath sounds normal. No respiratory distress. She has no wheezes.  Abdominal: Soft. Bowel sounds are normal. She exhibits no distension. There is no tenderness.  Musculoskeletal: Normal range of motion. She exhibits no edema.  Neurological: She is alert and oriented to person, place, and time. No cranial nerve deficit. Coordination normal.  Skin: Skin is warm and dry. No rash noted. No erythema.  Psychiatric: She has a normal mood and affect. Her behavior is normal. Judgment and thought content normal.  GU - defer to gynecology     Lab Results  Component Value Date   WBC 3.3* 05/21/2012   HGB 14.1 05/21/2012   HCT 41.9 05/21/2012   PLT 232.0 05/21/2012   CHOL 217* 05/21/2012   TRIG 124.0 05/21/2012   HDL 74.10 05/21/2012   LDLDIRECT 125.5 05/21/2012   ALT 12 05/21/2012   AST 21 05/21/2012   NA 141 05/21/2012   K 4.4 05/21/2012   CL 103 05/21/2012   CREATININE 0.7 05/21/2012   BUN 17 05/21/2012   CO2 32 05/21/2012   TSH 2.12 05/21/2012   ECG: sinus brady @ 58 bpm, no ischemia -  unchanged Assessment & Plan:   CPX - Patient has been counseled on age-appropriate routine health concerns for screening and prevention. These are reviewed and up-to-date. Immunizations are up-to-date or declined. Labs and ECG reviewed (NSR, no ischemia; no arrythmia)  Motion sickness - transderm patch as needed

## 2012-06-27 ENCOUNTER — Ambulatory Visit: Payer: BC Managed Care – PPO | Admitting: Internal Medicine

## 2012-07-01 ENCOUNTER — Ambulatory Visit (INDEPENDENT_AMBULATORY_CARE_PROVIDER_SITE_OTHER): Payer: BC Managed Care – PPO | Admitting: Internal Medicine

## 2012-07-01 ENCOUNTER — Ambulatory Visit: Payer: BC Managed Care – PPO | Admitting: Internal Medicine

## 2012-07-01 ENCOUNTER — Encounter: Payer: Self-pay | Admitting: Internal Medicine

## 2012-07-01 ENCOUNTER — Ambulatory Visit (INDEPENDENT_AMBULATORY_CARE_PROVIDER_SITE_OTHER)
Admission: RE | Admit: 2012-07-01 | Discharge: 2012-07-01 | Disposition: A | Discharge status: Home / Self Care | Payer: BC Managed Care – PPO | Source: Ambulatory Visit | Attending: Internal Medicine | Admitting: Internal Medicine

## 2012-07-01 VITALS — BP 132/80 | HR 63 | Temp 97.3°F | Ht 61.0 in | Wt 108.6 lb

## 2012-07-01 DIAGNOSIS — M25473 Effusion, unspecified ankle: Secondary | ICD-10-CM

## 2012-07-01 DIAGNOSIS — M25579 Pain in unspecified ankle and joints of unspecified foot: Secondary | ICD-10-CM

## 2012-07-01 DIAGNOSIS — M25571 Pain in right ankle and joints of right foot: Secondary | ICD-10-CM

## 2012-07-01 DIAGNOSIS — M25471 Effusion, right ankle: Secondary | ICD-10-CM

## 2012-07-01 DIAGNOSIS — S93409A Sprain of unspecified ligament of unspecified ankle, initial encounter: Secondary | ICD-10-CM

## 2012-07-01 MED ORDER — POTASSIUM CHLORIDE ER 10 MEQ PO TBCR: 10.0000 meq | EXTENDED_RELEASE_TABLET | Freq: Every day | ORAL | Status: AC

## 2012-07-01 NOTE — Patient Instructions (Signed)
It was good to see you today. Test(s) ordered today. Your results will be called to you after review (48-72hours after test completion). If any changes need to be made, you will be notified at that time. Alternate between ibuprofen and tylenol for aches, pain and swelling symptoms as discussed Continue to ice your ankle and wear supportive shoes Ankle Sprain An ankle sprain is an injury to the strong, fibrous tissues (ligaments) that hold the bones of your ankle joint together.   CAUSES Ankle sprain usually is caused by a fall or by twisting your ankle. People who participate in sports are more prone to these types of injuries.   SYMPTOMS   Symptoms of ankle sprain include:  Pain in your ankle. The pain may be present at rest or only when you are trying to stand or walk.   Swelling.   Bruising. Bruising may develop immediately or within 1 to 2 days after your injury.   Difficulty standing or walking.  DIAGNOSIS   Your caregiver will ask you details about your injury and perform a physical exam of your ankle to determine if you have an ankle sprain. During the physical exam, your caregiver will press and squeeze specific areas of your foot and ankle. Your caregiver will try to move your ankle in certain ways. An X-ray exam may be done to be sure a bone was not broken or a ligament did not separate from one of the bones in your ankle (avulsion).   TREATMENT   Certain types of braces can help stabilize your ankle. Your caregiver can make a recommendation for this. Your caregiver may recommend the use of medication for pain. If your sprain is severe, your caregiver may refer you to a surgeon who helps to restore function to parts of your skeletal system (orthopedist) or a physical therapist. HOME CARE INSTRUCTIONS   Apply ice to your injury for 1 to 2 days or as directed by your caregiver. Applying ice helps to reduce inflammation and pain.  Put ice in a plastic bag.   Place a towel between  your skin and the bag.   Leave the ice on for 15 to 20 minutes at a time, every 2 hours while you are awake.   Take over-the-counter or prescription medicines for pain, discomfort, or fever only as directed by your caregiver.   Keep your injured leg elevated, when possible, to lessen swelling.   If your caregiver recommends crutches, use them as instructed. Gradually, put weight on the affected ankle. Continue to use crutches or a cane until you can walk without feeling pain in your ankle.   If you have a plaster splint, wear the splint as directed by your caregiver. Do not rest it on anything harder than a pillow the first 24 hours. Do not put weight on it. Do not get it wet. You may take it off to take a shower or bath.   You may have been given an elastic bandage to wear around your ankle to provide support. If the elastic bandage is too tight (you have numbness or tingling in your foot or your foot becomes cold and blue), adjust the bandage to make it comfortable.   If you have an air splint, you may blow more air into it or let air out to make it more comfortable. You may take your splint off at night and before taking a shower or bath.   Wiggle your toes in the splint several times per day if  you are able.  SEEK MEDICAL CARE IF:    You have an increase in bruising, swelling, or pain.   Your toes feel cold.   Pain relief is not achieved with medication.  SEEK IMMEDIATE MEDICAL CARE IF: Your toes are numb or blue or you have severe pain. MAKE SURE YOU:    Understand these instructions.   Will watch your condition.   Will get help right away if you are not doing well or get worse.  Document Released: 11/26/2005 Document Revised: 11/15/2011 Document Reviewed: 06/30/2008 [facility] Patient Information 2012 [place], [place].

## 2012-07-01 NOTE — Progress Notes (Signed)
  Subjective:    Patient ID: Jasmine Pham, female    DOB: March 14, 1946, 66 y.o.   MRN: [id number]  Injury The incident occurred more than 1 week ago (06/15/12). Incident location: hiking in [place]. The injury mechanism was a twisted joint. Context: hiking trial. There is an injury to the right ankle. The pain is mild. It is unlikely that a foreign body is present. Pertinent negatives include no headaches, inability to bear weight, loss of consciousness, neck pain, numbness, seizures, tingling or weakness. There have been no prior injuries to these areas.  Pain relieved with tylenol, but persisting swelling > 2 weeks. No bruising    Past Medical History  Diagnosis Date  . UNSPECIFIED MENIERES DISEASE   . COLONIC POLYPS, HX OF   . HYPERLIPIDEMIA   . ALLERGIC RHINITIS   . Anxiety      Review of Systems  Constitutional: Negative for fever and fatigue.  HENT: Negative for neck pain.   Musculoskeletal: Positive for joint swelling. Negative for myalgias, back pain, arthralgias and gait problem.  Neurological: Negative for tingling, seizures, loss of consciousness, syncope, weakness, numbness and headaches.       Objective:   Physical Exam BP 132/80  Pulse 63  Temp 97.3 F (36.3 C) (Oral)  Ht 5\' 1"  (1.549 m)  Wt 108 lb 9.6 oz (49.261 kg)  BMI 20.52 kg/m2  SpO2 98% Gen: NAD MSkel: R ankle - diffuse soft tissue swelling - pain anteriolaterally to palpation, pain with passive stress of lateral ligaments (inversion) but FROM and normal WB/gait, no pain on external rotation of ankle - neurovasc intact distally Lung: CTA CV: RRR      Assessment & Plan:  R ankle pain and swelling - injury and twist 06/15/12 (>2 weeks) ?sprain vs fx Check xray now Plan refer to ortho as needed  addendum - no fracture on xray - Continue conservative care (walking boot support, NSAIDs, ice) as needed and call for ortho to wrap/brace if worsening symptoms or continued swelling after 6 weeks

## 2012-07-03 ENCOUNTER — Ambulatory Visit: Payer: BC Managed Care – PPO | Admitting: Internal Medicine

## 2012-08-27 ENCOUNTER — Telehealth: Payer: Self-pay | Admitting: Internal Medicine

## 2012-08-27 DIAGNOSIS — M79646 Pain in unspecified finger(s): Secondary | ICD-10-CM

## 2012-08-27 NOTE — Telephone Encounter (Signed)
Pt says she fell 3 wks ago and requesting a orthopedic referral for a stiff finger--

## 2012-08-27 NOTE — Telephone Encounter (Signed)
Pt.notified

## 2012-08-27 NOTE — Telephone Encounter (Signed)
Orthopedic referral done.

## 2012-10-23 ENCOUNTER — Encounter: Payer: Self-pay | Admitting: Internal Medicine

## 2012-12-11 ENCOUNTER — Ambulatory Visit (INDEPENDENT_AMBULATORY_CARE_PROVIDER_SITE_OTHER): Payer: BC Managed Care – PPO | Admitting: Internal Medicine

## 2012-12-11 ENCOUNTER — Encounter: Payer: Self-pay | Admitting: Internal Medicine

## 2012-12-11 VITALS — BP 138/78 | HR 61 | Temp 97.6°F | Ht 61.0 in | Wt 109.0 lb

## 2012-12-11 DIAGNOSIS — E785 Hyperlipidemia, unspecified: Secondary | ICD-10-CM

## 2012-12-11 DIAGNOSIS — L732 Hidradenitis suppurativa: Secondary | ICD-10-CM

## 2012-12-11 NOTE — Progress Notes (Signed)
  Subjective:    Patient ID: Jasmine Pham, female    DOB: 01-Oct-1946, 67 y.o.   MRN: [id number]  HPI  Concerned about knot under L armpit Onset 1 week ago but improved in last 24 h associated with swelling and tenderness initially, but now "hardly noticeable" No exposure outdoors No hx same No other masses Last mammo 03/2012 WNL -  Also ?change statin to other drug  Past Medical History  Diagnosis Date  . UNSPECIFIED MENIERES DISEASE   . COLONIC POLYPS, HX OF   . HYPERLIPIDEMIA   . ALLERGIC RHINITIS   . Anxiety      Review of Systems  Constitutional: Negative for fever and fatigue.  Musculoskeletal: Negative for myalgias and arthralgias.       Objective:   Physical Exam BP 138/78  Pulse 61  Temp 97.6 F (36.4 C) (Oral)  Ht 5\' 1"  (1.549 m)  Wt 109 lb (49.442 kg)  BMI 20.60 kg/m2  SpO2 97% Wt Readings from Last 3 Encounters:  12/11/12 109 lb (49.442 kg)  07/01/12 108 lb 9.6 oz (49.261 kg)  05/28/12 107 lb (48.535 kg)   Constitutional: She appears well-developed and well-nourished. No distress.  Neck: Normal range of motion. Neck supple. No JVD present. No thyromegaly present.  Cardiovascular: Normal rate, regular rhythm and normal heart sounds.  No murmur heard. No BLE edema. Pulmonary/Chest: Effort normal and breath sounds normal. No respiratory distress. She has no wheezes.  Skin: mild erythematous nodular gland, no swelling, fluctuance or tenderness under L axilla - 4mm diameter - no drainage -Skin is warm and dry. No rash noted. No erythema.  Psychiatric: She has a normal mood and affect. Her behavior is normal. Judgment and thought content normal.   Lab Results  Component Value Date   WBC 3.3* 05/21/2012   HGB 14.1 05/21/2012   HCT 41.9 05/21/2012   PLT 232.0 05/21/2012   GLUCOSE 70 05/21/2012   CHOL 217* 05/21/2012   TRIG 124.0 05/21/2012   HDL 74.10 05/21/2012   LDLDIRECT 125.5 05/21/2012   LDLCALC 88 02/17/2010   ALT 12 05/21/2012   AST 21 05/21/2012   NA  141 05/21/2012   K 4.4 05/21/2012   CL 103 05/21/2012   CREATININE 0.7 05/21/2012   BUN 17 05/21/2012   CO2 32 05/21/2012   TSH 2.12 05/21/2012        Assessment & Plan:   inflamed gland of L axilla - no infection or need for I&D -  conservative/supportive care and education provided The patient is reassured that these symptoms do not appear to represent a serious or threatening condition.

## 2012-12-11 NOTE — Patient Instructions (Addendum)
It was good to see you today. The knot under your armpit is an inflamed gland - no infection at this time so no need for antibiotics or other medical treatment -  Use warm compress for 5 min 2-3x/day as needed and call if increasing swelling, pain or redness for treatment as needed Other Medications reviewed, no changes at this time. Hidradenitis Suppurativa, Sweat Gland Abscess Hidradenitis suppurativa is a long lasting (chronic), uncommon disease of the sweat glands. With this, boil-like lumps and scarring develop in the groin, some times under the arms (axillae), and under the breasts. It may also uncommonly occur behind the ears, in the crease of the buttocks, and around the genitals.   CAUSES   The cause is from a blocking of the sweat glands. They then become infected. It may cause drainage and odor. It is not contagious. So it cannot be given to someone else. It most often shows up in puberty (about 24 to 65 years of age). But it may happen much later. It is similar to acne which is a disease of the sweat glands. This condition is slightly more common in African-Americans and women. SYMPTOMS    Hidradenitis usually starts as one or more red, tender, swellings in the groin or under the arms (axilla).   Over a period of hours to days the lesions get larger. They often open to the skin surface, draining clear to yellow-colored fluid.   The infected area heals with scarring.  DIAGNOSIS   Your caregiver makes this diagnosis by looking at you. Sometimes cultures (growing germs on plates in the lab) may be taken. This is to see what germ (bacterium) is causing the infection.   TREATMENT    Topical germ killing medicine applied to the skin (antibiotics) are the treatment of choice. Antibiotics taken by mouth (systemic) are sometimes needed when the condition is getting worse or is severe.   Avoid tight-fitting clothing which traps moisture in.   Dirt does not cause hidradenitis and it is not  caused by poor hygiene.   Involved areas should be cleaned daily using an antibacterial soap. Some patients find that the liquid form of Lever 2000, applied to the involved areas as a lotion after bathing, can help reduce the odor related to this condition.   Sometimes surgery is needed to drain infected areas or remove scarred tissue. Removal of large amounts of tissue is used only in severe cases.   Birth control pills may be helpful.   Oral retinoids (vitamin A derivatives) for 6 to 12 months which are effective for acne may also help this condition.   Weight loss will improve but not cure hidradenitis. It is made worse by being overweight. But the condition is not caused by being overweight.   This condition is more common in people who have had acne.   It may become worse under stress.  There is no medical cure for hidradenitis. It can be controlled, but not cured. The condition usually continues for years with periods of getting worse and getting better (remission). Document Released: 07/10/2004 Document Revised: 02/18/2012 Document Reviewed: 07/26/2008 [facility] Patient Information 2013 [place], [place].

## 2012-12-11 NOTE — Assessment & Plan Note (Signed)
On statin, intermittent use. No  Other med for primary prevention outside of statin class recommended   There have been some probable medication and dietary compliance issues here. I have discussed with her the great importance of following the treatment plan exactly as directed in order to achieve a good medical outcome. The current medical regimen is effective;  continue present plan and medications.

## 2013-01-10 LAB — HM DEXA SCAN

## 2013-02-24 ENCOUNTER — Other Ambulatory Visit: Payer: Self-pay

## 2013-02-24 DIAGNOSIS — Z1231 Encounter for screening mammogram for malignant neoplasm of breast: Secondary | ICD-10-CM

## 2013-03-24 ENCOUNTER — Ambulatory Visit
Admission: RE | Admit: 2013-03-24 | Discharge: 2013-03-24 | Disposition: A | Discharge status: Home / Self Care | Payer: BC Managed Care – PPO | Source: Ambulatory Visit

## 2013-03-24 DIAGNOSIS — Z1231 Encounter for screening mammogram for malignant neoplasm of breast: Secondary | ICD-10-CM

## 2013-05-14 ENCOUNTER — Encounter: Payer: Self-pay | Admitting: Internal Medicine

## 2013-05-14 LAB — HM COLONOSCOPY

## 2013-07-03 ENCOUNTER — Other Ambulatory Visit: Payer: Self-pay | Admitting: Internal Medicine

## 2014-02-10 ENCOUNTER — Ambulatory Visit: Payer: BC Managed Care – PPO | Admitting: Internal Medicine

## 2014-02-22 ENCOUNTER — Other Ambulatory Visit: Payer: Self-pay

## 2014-02-22 DIAGNOSIS — Z1231 Encounter for screening mammogram for malignant neoplasm of breast: Secondary | ICD-10-CM

## 2014-03-08 ENCOUNTER — Other Ambulatory Visit (INDEPENDENT_AMBULATORY_CARE_PROVIDER_SITE_OTHER): Payer: PRIVATE HEALTH INSURANCE

## 2014-03-08 ENCOUNTER — Encounter: Payer: Self-pay | Admitting: Internal Medicine

## 2014-03-08 ENCOUNTER — Ambulatory Visit (INDEPENDENT_AMBULATORY_CARE_PROVIDER_SITE_OTHER): Payer: PRIVATE HEALTH INSURANCE | Admitting: Internal Medicine

## 2014-03-08 VITALS — BP 118/72 | HR 58 | Temp 97.8°F | Wt 107.0 lb

## 2014-03-08 DIAGNOSIS — Z23 Encounter for immunization: Secondary | ICD-10-CM

## 2014-03-08 DIAGNOSIS — Z0000 Encounter for general adult medical examination without abnormal findings: Secondary | ICD-10-CM

## 2014-03-08 DIAGNOSIS — E785 Hyperlipidemia, unspecified: Secondary | ICD-10-CM

## 2014-03-08 LAB — HEPATIC FUNCTION PANEL
ALK PHOS: 70 U/L (ref 39–117)
ALT: 21 U/L (ref 0–35)
AST: 31 U/L (ref 0–37)
Albumin: 4.1 g/dL (ref 3.5–5.2)
BILIRUBIN DIRECT: 0.2 mg/dL (ref 0.0–0.3)
Total Bilirubin: 1.4 mg/dL — ABNORMAL HIGH (ref 0.3–1.2)
Total Protein: 6.6 g/dL (ref 6.0–8.3)

## 2014-03-08 LAB — LIPID PANEL
CHOL/HDL RATIO: 2
Cholesterol: 193 mg/dL (ref 0–200)
HDL: 81.7 mg/dL (ref >39.00)
LDL CALC: 90 mg/dL (ref 0–99)
Triglycerides: 106 mg/dL (ref 0.0–149.0)
VLDL: 21.2 mg/dL (ref 0.0–40.0)

## 2014-03-08 LAB — URINALYSIS, ROUTINE W REFLEX MICROSCOPIC
Bilirubin Urine: NEGATIVE
Ketones, ur: NEGATIVE
LEUKOCYTES UA: NEGATIVE
Nitrite: NEGATIVE
PH: 6 (ref 5.0–8.0)
Specific Gravity, Urine: 1.01 (ref 1.000–1.030)
TOTAL PROTEIN, URINE-UPE24: NEGATIVE
Urine Glucose: NEGATIVE
Urobilinogen, UA: 0.2 (ref 0.0–1.0)

## 2014-03-08 LAB — BASIC METABOLIC PANEL
BUN: 12 mg/dL (ref 6–23)
CALCIUM: 9.2 mg/dL (ref 8.4–10.5)
CO2: 28 meq/L (ref 19–32)
CREATININE: 0.7 mg/dL (ref 0.4–1.2)
Chloride: 102 mEq/L (ref 96–112)
GFR: 93.63 mL/min (ref >60.00)
GLUCOSE: 81 mg/dL (ref 70–99)
Potassium: 3.9 mEq/L (ref 3.5–5.1)
SODIUM: 138 meq/L (ref 135–145)

## 2014-03-08 LAB — TSH: TSH: 1.87 u[IU]/mL (ref 0.35–5.50)

## 2014-03-08 NOTE — Assessment & Plan Note (Signed)
On statin, intermittent use. No other med for primary prevention outside of statin class recommended   Check labs annually, titrate as needed There have been some probable medication and dietary compliance issues here. I have discussed with her the great importance of following the treatment plan exactly as directed in order to achieve a good medical outcome.

## 2014-03-08 NOTE — Progress Notes (Signed)
Pre visit review using our clinic review tool, if applicable. No additional management support is needed unless otherwise documented below in the visit note. 

## 2014-03-08 NOTE — Progress Notes (Signed)
Subjective:    Patient ID: Jasmine Pham, female    DOB: Nov 13, 1946, 68 y.o.   MRN: [id number]  HPI   Here for medicare wellness  Diet: heart healthy or DM if diabetic Physical activity: sedentary Depression/mood screen: negative Hearing: intact to whispered voice Visual acuity: grossly normal, performs annual eye exam  ADLs: capable Fall risk: none Home safety: good Cognitive evaluation: intact to orientation, naming, recall and repetition EOL planning: adv directives, full code/ I agree  I have personally reviewed and have noted 1. The patient's medical and social history 2. Their use of alcohol, tobacco or illicit drugs 3. Their current medications and supplements 4. The patient's functional ability including ADL's, fall risks, home safety risks and hearing or visual impairment. 5. Diet and physical activities 6. Evidence for depression or mood disorders  Also reviewed chronic medical issues and interval medical events  Past Medical History  Diagnosis Date  . UNSPECIFIED MENIERES DISEASE   . COLONIC POLYPS, HX OF   . HYPERLIPIDEMIA   . ALLERGIC RHINITIS   . Anxiety    Family History  Problem Relation Age of Onset  . Prostate cancer Father   . Colon cancer Other   . Hyperlipidemia Other   . Kidney disease Other    History  Substance Use Topics  . Smoking status: Never Smoker   . Smokeless tobacco: Not on file  . Alcohol Use: No    Review of Systems  Constitutional: Negative for fatigue and unexpected weight change.  Respiratory: Negative for cough, shortness of breath and wheezing.   Cardiovascular: Negative for chest pain, palpitations and leg swelling.  Gastrointestinal: Negative for nausea, abdominal pain and diarrhea.  Neurological: Negative for dizziness, weakness, light-headedness and headaches.  Psychiatric/Behavioral: Negative for dysphoric mood. The patient is not nervous/anxious.   All other systems reviewed and are negative.         Objective:   Physical Exam  BP 118/72  Pulse 58  Temp(Src) 97.8 F (36.6 C) (Oral)  Wt 107 lb (48.535 kg)  SpO2 98% Wt Readings from Last 3 Encounters:  03/08/14 107 lb (48.535 kg)  12/11/12 109 lb (49.442 kg)  07/01/12 108 lb 9.6 oz (49.261 kg)   Constitutional: She appears well-developed and well-nourished. No distress.  HENT: Head: Normocephalic and atraumatic. Ears: B TMs ok, no erythema or effusion; Nose: Nose normal. Mouth/Throat: Oropharynx is clear and moist. No oropharyngeal exudate.  Eyes: Conjunctivae and EOM are normal. Pupils are equal, round, and reactive to light. No scleral icterus.  Neck: Normal range of motion. Neck supple. No JVD present. No thyromegaly present.  Cardiovascular: Normal rate, regular rhythm and normal heart sounds.  No murmur heard. No BLE edema. Pulmonary/Chest: Effort normal and breath sounds normal. No respiratory distress. She has no wheezes.  Abdominal: Soft. Bowel sounds are normal. She exhibits no distension. There is no tenderness. no masses Musculoskeletal: Normal range of motion, no joint effusions. No gross deformities Neurological: She is alert and oriented to person, place, and time. No cranial nerve deficit. Coordination, balance, strength, speech and gait are normal.  Skin: Skin is warm and dry. No rash noted. No erythema.  Psychiatric: She has a normal mood and affect. Her behavior is normal. Judgment and thought content normal.    Lab Results  Component Value Date   WBC 3.3* 05/21/2012   HGB 14.1 05/21/2012   HCT 41.9 05/21/2012   PLT 232.0 05/21/2012   GLUCOSE 70 05/21/2012   CHOL 217* 05/21/2012  TRIG 124.0 05/21/2012   HDL 74.10 05/21/2012   LDLDIRECT 125.5 05/21/2012   LDLCALC 88 02/17/2010   ALT 12 05/21/2012   AST 21 05/21/2012   NA 141 05/21/2012   K 4.4 05/21/2012   CL 103 05/21/2012   CREATININE 0.7 05/21/2012   BUN 17 05/21/2012   CO2 32 05/21/2012   TSH 2.12 05/21/2012    Mm Digital Screening  03/24/2013   *RADIOLOGY  REPORT*  Clinical Data: Screening.  DIGITAL BILATERAL SCREENING MAMMOGRAM WITH CAD  Comparison:  Previous exams.  FINDINGS:  ACR Breast Density Category 2: There is a scattered fibroglandular pattern.  No suspicious masses, architectural distortion, or calcifications are present.  Images were processed with CAD.  IMPRESSION: No mammographic evidence of malignancy.  A result letter of this screening mammogram will be mailed directly to the patient.  RECOMMENDATION: Screening mammogram in one year. (Code:SM-B-01Y)  BI-RADS CATEGORY 1:  Negative.   Original Report Authenticated By: Britta MccreedySusan Turner, M.D.       Assessment & Plan:   CPX/v70.0/AWV - Today patient counseled on age appropriate routine health concerns for screening and prevention, each reviewed and up to date or declined. Immunizations reviewed and up to date or declined. Labs/ECG reviewed. Risk factors for depression reviewed and negative. Hearing function and visual acuity are intact. ADLs screened and addressed as needed. Functional ability and level of safety reviewed and appropriate. Education, counseling and referrals performed based on assessed risks today. Patient provided with a copy of personalized plan for preventive services.  Problem List Items Addressed This Visit   HYPERLIPIDEMIA     On statin, intermittent use. No other med for primary prevention outside of statin class recommended   Check labs annually, titrate as needed There have been some probable medication and dietary compliance issues here. I have discussed with her the great importance of following the treatment plan exactly as directed in order to achieve a good medical outcome.    Relevant Orders      Lipid panel    Other Visit Diagnoses   Routine general medical examination at a health care facility    -  Primary    Relevant Orders       Basic metabolic panel       CBC with Differential       Hepatic function panel       Lipid panel       TSH       Urinalysis,  Routine w reflex microscopic

## 2014-03-08 NOTE — Patient Instructions (Addendum)
It was good to see you today.  We have reviewed your prior records including labs and tests today  Health Maintenance reviewed - Pneumovax updated today -all other recommended immunizations and age-appropriate screenings are up-to-date.  Test(s) ordered today. Your results will be released to [place] (or called to you) after review, usually within 72hours after test completion. If any changes need to be made, you will be notified at that same time.  Please schedule followup in 12 months for annual exam and labs, call sooner if problems.  Health Maintenance, Female A healthy lifestyle and preventative care can promote health and wellness.  Maintain regular health, dental, and eye exams.  Eat a healthy diet. Foods like vegetables, fruits, whole grains, low-fat dairy products, and lean protein foods contain the nutrients you need without too many calories. Decrease your intake of foods high in solid fats, added sugars, and salt. Get information about a proper diet from your caregiver, if necessary.  Regular physical exercise is one of the most important things you can do for your health. Most adults should get at least 150 minutes of moderate-intensity exercise (any activity that increases your heart rate and causes you to sweat) each week. In addition, most adults need muscle-strengthening exercises on 2 or more days a week.   Maintain a healthy weight. The body mass index (BMI) is a screening tool to identify possible weight problems. It provides an estimate of body fat based on height and weight. Your caregiver can help determine your BMI, and can help you achieve or maintain a healthy weight. For adults 20 years and older:  A BMI below 18.5 is considered underweight.  A BMI of 18.5 to 24.9 is normal.  A BMI of 25 to 29.9 is considered overweight.  A BMI of 30 and above is considered obese.  Maintain normal blood lipids and cholesterol by exercising and minimizing your intake of  saturated fat. Eat a balanced diet with plenty of fruits and vegetables. Blood tests for lipids and cholesterol should begin at age 77 and be repeated every 5 years. If your lipid or cholesterol levels are high, you are over 50, or you are a high risk for heart disease, you may need your cholesterol levels checked more frequently.Ongoing high lipid and cholesterol levels should be treated with medicines if diet and exercise are not effective.  If you smoke, find out from your caregiver how to quit. If you do not use tobacco, do not start.  Lung cancer screening is recommended for adults aged 33 80 years who are at high risk for developing lung cancer because of a history of smoking. Yearly low-dose computed tomography (CT) is recommended for people who have at least a 30-pack-year history of smoking and are a current smoker or have quit within the past 15 years. A pack year of smoking is smoking an average of 1 pack of cigarettes a day for 1 year (for example: 1 pack a day for 30 years or 2 packs a day for 15 years). Yearly screening should continue until the smoker has stopped smoking for at least 15 years. Yearly screening should also be stopped for people who develop a health problem that would prevent them from having lung cancer treatment.  If you are pregnant, do not drink alcohol. If you are breastfeeding, be very cautious about drinking alcohol. If you are not pregnant and choose to drink alcohol, do not exceed 1 drink per day. One drink is considered to be 12 ounces (355  mL) of beer, 5 ounces (148 mL) of wine, or 1.5 ounces (44 mL) of liquor.  Avoid use of street drugs. Do not share needles with anyone. Ask for help if you need support or instructions about stopping the use of drugs.  High blood pressure causes heart disease and increases the risk of stroke. Blood pressure should be checked at least every 1 to 2 years. Ongoing high blood pressure should be treated with medicines, if weight loss  and exercise are not effective.  If you are 65 to 66 years old, ask your caregiver if you should take aspirin to prevent strokes.  Diabetes screening involves taking a blood sample to check your fasting blood sugar level. This should be done once every 3 years, after age 50, if you are within normal weight and without risk factors for diabetes. Testing should be considered at a younger age or be carried out more frequently if you are overweight and have at least 1 risk factor for diabetes.  Breast cancer screening is essential preventative care for women. You should practice "breast self-awareness." This means understanding the normal appearance and feel of your breasts and may include breast self-examination. Any changes detected, no matter how small, should be reported to a caregiver. Women in their 37s and 30s should have a clinical breast exam (CBE) by a caregiver as part of a regular health exam every 1 to 3 years. After age 33, women should have a CBE every year. Starting at age 56, women should consider having a mammogram (breast X-ray) every year. Women who have a family history of breast cancer should talk to their caregiver about genetic screening. Women at a high risk of breast cancer should talk to their caregiver about having an MRI and a mammogram every year.  Breast cancer gene (BRCA)-related cancer risk assessment is recommended for women who have family members with BRCA-related cancers. BRCA-related cancers include breast, ovarian, tubal, and peritoneal cancers. Having family members with these cancers may be associated with an increased risk for harmful changes (mutations) in the breast cancer genes BRCA1 and BRCA2. Results of the assessment will determine the need for genetic counseling and BRCA1 and BRCA2 testing.  The Pap test is a screening test for cervical cancer. Women should have a Pap test starting at age 34. Between ages 21 and 29, Pap tests should be repeated every 2 years.  Beginning at age 90, you should have a Pap test every 3 years as long as the past 3 Pap tests have been normal. If you had a hysterectomy for a problem that was not cancer or a condition that could lead to cancer, then you no longer need Pap tests. If you are between ages 64 and 58, and you have had normal Pap tests going back 10 years, you no longer need Pap tests. If you have had past treatment for cervical cancer or a condition that could lead to cancer, you need Pap tests and screening for cancer for at least 20 years after your treatment. If Pap tests have been discontinued, risk factors (such as a new sexual partner) need to be reassessed to determine if screening should be resumed. Some women have medical problems that increase the chance of getting cervical cancer. In these cases, your caregiver may recommend more frequent screening and Pap tests.  The human papillomavirus (HPV) test is an additional test that may be used for cervical cancer screening. The HPV test looks for the virus that can cause the cell  changes on the cervix. The cells collected during the Pap test can be tested for HPV. The HPV test could be used to screen women aged 69 years and older, and should be used in women of any age who have unclear Pap test results. After the age of 21, women should have HPV testing at the same frequency as a Pap test.  Colorectal cancer can be detected and often prevented. Most routine colorectal cancer screening begins at the age of 13 and continues through age 61. However, your caregiver may recommend screening at an earlier age if you have risk factors for colon cancer. On a yearly basis, your caregiver may provide home test kits to check for hidden blood in the stool. Use of a small camera at the end of a tube, to directly examine the colon (sigmoidoscopy or colonoscopy), can detect the earliest forms of colorectal cancer. Talk to your caregiver about this at age 21, when routine screening begins.  Direct examination of the colon should be repeated every 5 to 10 years through age 53, unless early forms of pre-cancerous polyps or small growths are found.  Hepatitis C blood testing is recommended for all people born from [birth year] through 1965 and any individual with known risks for hepatitis C.  Practice safe sex. Use condoms and avoid high-risk sexual practices to reduce the spread of sexually transmitted infections (STIs). Sexually active women aged 28 and younger should be checked for Chlamydia, which is a common sexually transmitted infection. Older women with new or multiple partners should also be tested for Chlamydia. Testing for other STIs is recommended if you are sexually active and at increased risk.  Osteoporosis is a disease in which the bones lose minerals and strength with aging. This can result in serious bone fractures. The risk of osteoporosis can be identified using a bone density scan. Women ages 73 and over and women at risk for fractures or osteoporosis should discuss screening with their caregivers. Ask your caregiver whether you should be taking a calcium supplement or vitamin D to reduce the rate of osteoporosis.  Menopause can be associated with physical symptoms and risks. Hormone replacement therapy is available to decrease symptoms and risks. You should talk to your caregiver about whether hormone replacement therapy is right for you.  Use sunscreen. Apply sunscreen liberally and repeatedly throughout the day. You should seek shade when your shadow is shorter than you. Protect yourself by wearing long sleeves, pants, a wide-brimmed hat, and sunglasses year round, whenever you are outdoors.  Notify your caregiver of new moles or changes in moles, especially if there is a change in shape or color. Also notify your caregiver if a mole is larger than the size of a pencil eraser.  Stay current with your immunizations. Document Released: 06/11/2011 Document Revised: 03/23/2013  Document Reviewed: 06/11/2011 [facility] Patient Information 2014 [place]. Health Maintenance, Female A healthy lifestyle and preventative care can promote health and wellness.  Maintain regular health, dental, and eye exams.  Eat a healthy diet. Foods like vegetables, fruits, whole grains, low-fat dairy products, and lean protein foods contain the nutrients you need without too many calories. Decrease your intake of foods high in solid fats, added sugars, and salt. Get information about a proper diet from your caregiver, if necessary.  Regular physical exercise is one of the most important things you can do for your health. Most adults should get at least 150 minutes of moderate-intensity exercise (any activity that increases your heart rate and  causes you to sweat) each week. In addition, most adults need muscle-strengthening exercises on 2 or more days a week.   Maintain a healthy weight. The body mass index (BMI) is a screening tool to identify possible weight problems. It provides an estimate of body fat based on height and weight. Your caregiver can help determine your BMI, and can help you achieve or maintain a healthy weight. For adults 20 years and older:  A BMI below 18.5 is considered underweight.  A BMI of 18.5 to 24.9 is normal.  A BMI of 25 to 29.9 is considered overweight.  A BMI of 30 and above is considered obese.  Maintain normal blood lipids and cholesterol by exercising and minimizing your intake of saturated fat. Eat a balanced diet with plenty of fruits and vegetables. Blood tests for lipids and cholesterol should begin at age 18 and be repeated every 5 years. If your lipid or cholesterol levels are high, you are over 50, or you are a high risk for heart disease, you may need your cholesterol levels checked more frequently.Ongoing high lipid and cholesterol levels should be treated with medicines if diet and exercise are not effective.  If you smoke, find out from  your caregiver how to quit. If you do not use tobacco, do not start.  Lung cancer screening is recommended for adults aged 65 80 years who are at high risk for developing lung cancer because of a history of smoking. Yearly low-dose computed tomography (CT) is recommended for people who have at least a 30-pack-year history of smoking and are a current smoker or have quit within the past 15 years. A pack year of smoking is smoking an average of 1 pack of cigarettes a day for 1 year (for example: 1 pack a day for 30 years or 2 packs a day for 15 years). Yearly screening should continue until the smoker has stopped smoking for at least 15 years. Yearly screening should also be stopped for people who develop a health problem that would prevent them from having lung cancer treatment.  If you are pregnant, do not drink alcohol. If you are breastfeeding, be very cautious about drinking alcohol. If you are not pregnant and choose to drink alcohol, do not exceed 1 drink per day. One drink is considered to be 12 ounces (355 mL) of beer, 5 ounces (148 mL) of wine, or 1.5 ounces (44 mL) of liquor.  Avoid use of street drugs. Do not share needles with anyone. Ask for help if you need support or instructions about stopping the use of drugs.  High blood pressure causes heart disease and increases the risk of stroke. Blood pressure should be checked at least every 1 to 2 years. Ongoing high blood pressure should be treated with medicines, if weight loss and exercise are not effective.  If you are 82 to 66 years old, ask your caregiver if you should take aspirin to prevent strokes.  Diabetes screening involves taking a blood sample to check your fasting blood sugar level. This should be done once every 3 years, after age 27, if you are within normal weight and without risk factors for diabetes. Testing should be considered at a younger age or be carried out more frequently if you are overweight and have at least 1 risk  factor for diabetes.  Breast cancer screening is essential preventative care for women. You should practice "breast self-awareness." This means understanding the normal appearance and feel of your breasts and may include  breast self-examination. Any changes detected, no matter how small, should be reported to a caregiver. Women in their 87s and 30s should have a clinical breast exam (CBE) by a caregiver as part of a regular health exam every 1 to 3 years. After age 43, women should have a CBE every year. Starting at age 67, women should consider having a mammogram (breast X-ray) every year. Women who have a family history of breast cancer should talk to their caregiver about genetic screening. Women at a high risk of breast cancer should talk to their caregiver about having an MRI and a mammogram every year.  Breast cancer gene (BRCA)-related cancer risk assessment is recommended for women who have family members with BRCA-related cancers. BRCA-related cancers include breast, ovarian, tubal, and peritoneal cancers. Having family members with these cancers may be associated with an increased risk for harmful changes (mutations) in the breast cancer genes BRCA1 and BRCA2. Results of the assessment will determine the need for genetic counseling and BRCA1 and BRCA2 testing.  The Pap test is a screening test for cervical cancer. Women should have a Pap test starting at age 12. Between ages 3 and 76, Pap tests should be repeated every 2 years. Beginning at age 57, you should have a Pap test every 3 years as long as the past 3 Pap tests have been normal. If you had a hysterectomy for a problem that was not cancer or a condition that could lead to cancer, then you no longer need Pap tests. If you are between ages 20 and 74, and you have had normal Pap tests going back 10 years, you no longer need Pap tests. If you have had past treatment for cervical cancer or a condition that could lead to cancer, you need Pap tests  and screening for cancer for at least 20 years after your treatment. If Pap tests have been discontinued, risk factors (such as a new sexual partner) need to be reassessed to determine if screening should be resumed. Some women have medical problems that increase the chance of getting cervical cancer. In these cases, your caregiver may recommend more frequent screening and Pap tests.  The human papillomavirus (HPV) test is an additional test that may be used for cervical cancer screening. The HPV test looks for the virus that can cause the cell changes on the cervix. The cells collected during the Pap test can be tested for HPV. The HPV test could be used to screen women aged 37 years and older, and should be used in women of any age who have unclear Pap test results. After the age of 36, women should have HPV testing at the same frequency as a Pap test.  Colorectal cancer can be detected and often prevented. Most routine colorectal cancer screening begins at the age of 31 and continues through age 13. However, your caregiver may recommend screening at an earlier age if you have risk factors for colon cancer. On a yearly basis, your caregiver may provide home test kits to check for hidden blood in the stool. Use of a small camera at the end of a tube, to directly examine the colon (sigmoidoscopy or colonoscopy), can detect the earliest forms of colorectal cancer. Talk to your caregiver about this at age 25, when routine screening begins. Direct examination of the colon should be repeated every 5 to 10 years through age 73, unless early forms of pre-cancerous polyps or small growths are found.  Hepatitis C blood testing is recommended for  all people born from [birth year] through 1963 and any individual with known risks for hepatitis C.  Practice safe sex. Use condoms and avoid high-risk sexual practices to reduce the spread of sexually transmitted infections (STIs). Sexually active women aged 76 and younger should  be checked for Chlamydia, which is a common sexually transmitted infection. Older women with new or multiple partners should also be tested for Chlamydia. Testing for other STIs is recommended if you are sexually active and at increased risk.  Osteoporosis is a disease in which the bones lose minerals and strength with aging. This can result in serious bone fractures. The risk of osteoporosis can be identified using a bone density scan. Women ages 57 and over and women at risk for fractures or osteoporosis should discuss screening with their caregivers. Ask your caregiver whether you should be taking a calcium supplement or vitamin D to reduce the rate of osteoporosis.  Menopause can be associated with physical symptoms and risks. Hormone replacement therapy is available to decrease symptoms and risks. You should talk to your caregiver about whether hormone replacement therapy is right for you.  Use sunscreen. Apply sunscreen liberally and repeatedly throughout the day. You should seek shade when your shadow is shorter than you. Protect yourself by wearing long sleeves, pants, a wide-brimmed hat, and sunglasses year round, whenever you are outdoors.  Notify your caregiver of new moles or changes in moles, especially if there is a change in shape or color. Also notify your caregiver if a mole is larger than the size of a pencil eraser.  Stay current with your immunizations. Document Released: 06/11/2011 Document Revised: 03/23/2013 Document Reviewed: 06/11/2011 [facility] Patient Information 2014 [place].

## 2014-03-09 LAB — CBC WITH DIFFERENTIAL/PLATELET
BASOS ABS: 0 10*3/uL (ref 0.0–0.1)
Basophils Relative: 0.7 % (ref 0.0–3.0)
EOS ABS: 0 10*3/uL (ref 0.0–0.7)
Eosinophils Relative: 0.1 % (ref 0.0–5.0)
HCT: 41.8 % (ref 36.0–46.0)
HEMOGLOBIN: 14.4 g/dL (ref 12.0–15.0)
Lymphocytes Relative: 24 % (ref 12.0–46.0)
Lymphs Abs: 1.3 10*3/uL (ref 0.7–4.0)
MCHC: 34.5 g/dL (ref 30.0–36.0)
MCV: 93 fl (ref 78.0–100.0)
MONOS PCT: 3.6 % (ref 3.0–12.0)
Monocytes Absolute: 0.2 10*3/uL (ref 0.1–1.0)
NEUTROS ABS: 3.8 10*3/uL (ref 1.4–7.7)
NEUTROS PCT: 71.6 % (ref 43.0–77.0)
Platelets: 230 10*3/uL (ref 150.0–400.0)
RBC: 4.49 Mil/uL (ref 3.87–5.11)
RDW: 12.9 % (ref 11.5–14.6)
WBC: 5.3 10*3/uL (ref 4.5–10.5)

## 2014-03-30 ENCOUNTER — Ambulatory Visit
Admission: RE | Admit: 2014-03-30 | Discharge: 2014-03-30 | Disposition: A | Discharge status: Home / Self Care | Payer: PRIVATE HEALTH INSURANCE | Source: Ambulatory Visit

## 2014-03-30 DIAGNOSIS — Z1231 Encounter for screening mammogram for malignant neoplasm of breast: Secondary | ICD-10-CM

## 2014-10-19 ENCOUNTER — Ambulatory Visit: Payer: PRIVATE HEALTH INSURANCE | Admitting: Family

## 2014-10-19 ENCOUNTER — Ambulatory Visit: Payer: PRIVATE HEALTH INSURANCE | Admitting: Internal Medicine

## 2014-12-17 ENCOUNTER — Telehealth: Payer: Self-pay | Admitting: Internal Medicine

## 2014-12-17 NOTE — Telephone Encounter (Signed)
Rec'd from Dr. Hermelinda MedicusJames Crossley forward 2 pages to Dr. Felicity CoyerLeschber

## 2015-03-18 ENCOUNTER — Other Ambulatory Visit: Payer: Self-pay | Admitting: Internal Medicine

## 2015-03-22 ENCOUNTER — Ambulatory Visit: Payer: Self-pay | Admitting: Internal Medicine

## 2015-04-13 ENCOUNTER — Encounter: Payer: Self-pay | Admitting: Internal Medicine

## 2015-04-13 ENCOUNTER — Other Ambulatory Visit (INDEPENDENT_AMBULATORY_CARE_PROVIDER_SITE_OTHER): Payer: Medicare Other

## 2015-04-13 ENCOUNTER — Ambulatory Visit (INDEPENDENT_AMBULATORY_CARE_PROVIDER_SITE_OTHER): Payer: Medicare Other | Admitting: Internal Medicine

## 2015-04-13 VITALS — BP 132/76 | HR 61 | Temp 97.1°F | Resp 16 | Ht 61.0 in | Wt 107.8 lb

## 2015-04-13 DIAGNOSIS — Z23 Encounter for immunization: Secondary | ICD-10-CM

## 2015-04-13 DIAGNOSIS — G2581 Restless legs syndrome: Secondary | ICD-10-CM | POA: Diagnosis not present

## 2015-04-13 DIAGNOSIS — E785 Hyperlipidemia, unspecified: Secondary | ICD-10-CM

## 2015-04-13 DIAGNOSIS — Z79899 Other long term (current) drug therapy: Secondary | ICD-10-CM | POA: Diagnosis not present

## 2015-04-13 DIAGNOSIS — Z0000 Encounter for general adult medical examination without abnormal findings: Secondary | ICD-10-CM

## 2015-04-13 LAB — IRON AND TIBC
%SAT: 37 % (ref 20–55)
Iron: 116 ug/dL (ref 42–145)
TIBC: 311 ug/dL (ref 250–470)
UIBC: 195 ug/dL (ref 125–400)

## 2015-04-13 LAB — CBC
HCT: 42.2 % (ref 36.0–46.0)
Hemoglobin: 14.9 g/dL (ref 12.0–15.0)
MCHC: 35.4 g/dL (ref 30.0–36.0)
MCV: 89.1 fl (ref 78.0–100.0)
PLATELETS: 279 10*3/uL (ref 150.0–400.0)
RBC: 4.74 Mil/uL (ref 3.87–5.11)
RDW: 12.9 % (ref 11.5–15.5)
WBC: 4.6 10*3/uL (ref 4.0–10.5)

## 2015-04-13 LAB — COMPREHENSIVE METABOLIC PANEL
ALT: 13 U/L (ref 0–35)
AST: 24 U/L (ref 0–37)
Albumin: 4.1 g/dL (ref 3.5–5.2)
Alkaline Phosphatase: 91 U/L (ref 39–117)
BUN: 10 mg/dL (ref 6–23)
CHLORIDE: 103 meq/L (ref 96–112)
CO2: 32 mEq/L (ref 19–32)
CREATININE: 0.73 mg/dL (ref 0.40–1.20)
Calcium: 9.6 mg/dL (ref 8.4–10.5)
GFR: 84.52 mL/min (ref >60.00)
Glucose, Bld: 84 mg/dL (ref 70–99)
Potassium: 4.1 mEq/L (ref 3.5–5.1)
Sodium: 140 mEq/L (ref 135–145)
Total Bilirubin: 1.4 mg/dL — ABNORMAL HIGH (ref 0.2–1.2)
Total Protein: 7.1 g/dL (ref 6.0–8.3)

## 2015-04-13 LAB — FERRITIN: Ferritin: 62.8 ng/mL (ref 10.0–291.0)

## 2015-04-13 LAB — LIPID PANEL
Cholesterol: 211 mg/dL — ABNORMAL HIGH (ref 0–200)
HDL: 83.8 mg/dL (ref >39.00)
LDL CALC: 107 mg/dL — AB (ref 0–99)
NonHDL: 127.2
Total CHOL/HDL Ratio: 3
Triglycerides: 103 mg/dL (ref 0.0–149.0)
VLDL: 20.6 mg/dL (ref 0.0–40.0)

## 2015-04-13 NOTE — Progress Notes (Signed)
Pre visit review using our clinic review tool, if applicable. No additional management support is needed unless otherwise documented below in the visit note. 

## 2015-04-13 NOTE — Assessment & Plan Note (Signed)
Currently on crestor, no side effects. Checking lipid panel and adjust as needed.

## 2015-04-13 NOTE — Patient Instructions (Signed)
We will give you the prevnar which is a pneumonia booster shot and will cover you for 40+ years.   You are due for the bone density scan next February.   We will check the labs today as well as the iron levels. Low iron levels can make restless leg worse so if your levels are low increasing them may help with your symptoms.  Come back next year for a check up and feel free to call the office if you have problems before then.  Health Maintenance Adopting a healthy lifestyle and getting preventive care can go a long way to promote health and wellness. Talk with your health care provider about what schedule of regular examinations is right for you. This is a good chance for you to check in with your provider about disease prevention and staying healthy. In between checkups, there are plenty of things you can do on your own. Experts have done a lot of research about which lifestyle changes and preventive measures are most likely to keep you healthy. Ask your health care provider for more information. WEIGHT AND DIET  Eat a healthy diet  Be sure to include plenty of vegetables, fruits, low-fat dairy products, and lean protein.  Do not eat a lot of foods high in solid fats, added sugars, or salt.  Get regular exercise. This is one of the most important things you can do for your health.  Most adults should exercise for at least 150 minutes each week. The exercise should increase your heart rate and make you sweat (moderate-intensity exercise).  Most adults should also do strengthening exercises at least twice a week. This is in addition to the moderate-intensity exercise.  Maintain a healthy weight  Body mass index (BMI) is a measurement that can be used to identify possible weight problems. It estimates body fat based on height and weight. Your health care provider can help determine your BMI and help you achieve or maintain a healthy weight.  For females 68 years of age and older:   A BMI  below 18.5 is considered underweight.  A BMI of 18.5 to 24.9 is normal.  A BMI of 25 to 29.9 is considered overweight.  A BMI of 30 and above is considered obese.  Watch levels of cholesterol and blood lipids  You should start having your blood tested for lipids and cholesterol at 67 years of age, then have this test every 5 years.  You may need to have your cholesterol levels checked more often if:  Your lipid or cholesterol levels are high.  You are older than 67 years of age.  You are at high risk for heart disease.  CANCER SCREENING   Lung Cancer  Lung cancer screening is recommended for adults 31-56 years old who are at high risk for lung cancer because of a history of smoking.  A yearly low-dose CT scan of the lungs is recommended for people who:  Currently smoke.  Have quit within the past 15 years.  Have at least a 30-pack-year history of smoking. A pack year is smoking an average of one pack of cigarettes a day for 1 year.  Yearly screening should continue until it has been 15 years since you quit.  Yearly screening should stop if you develop a health problem that would prevent you from having lung cancer treatment.  Breast Cancer  Practice breast self-awareness. This means understanding how your breasts normally appear and feel.  It also means doing regular breast self-exams.  Let your health care provider know about any changes, no matter how small.  If you are in your 20s or 30s, you should have a clinical breast exam (CBE) by a health care provider every 1-3 years as part of a regular health exam.  If you are 36 or older, have a CBE every year. Also consider having a breast X-ray (mammogram) every year.  If you have a family history of breast cancer, talk to your health care provider about genetic screening.  If you are at high risk for breast cancer, talk to your health care provider about having an MRI and a mammogram every year.  Breast cancer gene  (BRCA) assessment is recommended for women who have family members with BRCA-related cancers. BRCA-related cancers include:  Breast.  Ovarian.  Tubal.  Peritoneal cancers.  Results of the assessment will determine the need for genetic counseling and BRCA1 and BRCA2 testing. Cervical Cancer Routine pelvic examinations to screen for cervical cancer are no longer recommended for nonpregnant women who are considered low risk for cancer of the pelvic organs (ovaries, uterus, and vagina) and who do not have symptoms. A pelvic examination may be necessary if you have symptoms including those associated with pelvic infections. Ask your health care provider if a screening pelvic exam is right for you.   The Pap test is the screening test for cervical cancer for women who are considered at risk.  If you had a hysterectomy for a problem that was not cancer or a condition that could lead to cancer, then you no longer need Pap tests.  If you are older than 65 years, and you have had normal Pap tests for the past 10 years, you no longer need to have Pap tests.  If you have had past treatment for cervical cancer or a condition that could lead to cancer, you need Pap tests and screening for cancer for at least 20 years after your treatment.  If you no longer get a Pap test, assess your risk factors if they change (such as having a new sexual partner). This can affect whether you should start being screened again.  Some women have medical problems that increase their chance of getting cervical cancer. If this is the case for you, your health care provider may recommend more frequent screening and Pap tests.  The human papillomavirus (HPV) test is another test that may be used for cervical cancer screening. The HPV test looks for the virus that can cause cell changes in the cervix. The cells collected during the Pap test can be tested for HPV.  The HPV test can be used to screen women 3 years of age and  older. Getting tested for HPV can extend the interval between normal Pap tests from three to five years.  An HPV test also should be used to screen women of any age who have unclear Pap test results.  After 67 years of age, women should have HPV testing as often as Pap tests.  Colorectal Cancer  This type of cancer can be detected and often prevented.  Routine colorectal cancer screening usually begins at 67 years of age and continues through 67 years of age.  Your health care provider may recommend screening at an earlier age if you have risk factors for colon cancer.  Your health care provider may also recommend using home test kits to check for hidden blood in the stool.  A small camera at the end of a tube can be used  to examine your colon directly (sigmoidoscopy or colonoscopy). This is done to check for the earliest forms of colorectal cancer.  Routine screening usually begins at age 27.  Direct examination of the colon should be repeated every 5-10 years through 67 years of age. However, you may need to be screened more often if early forms of precancerous polyps or small growths are found. Skin Cancer  Check your skin from head to toe regularly.  Tell your health care provider about any new moles or changes in moles, especially if there is a change in a mole's shape or color.  Also tell your health care provider if you have a mole that is larger than the size of a pencil eraser.  Always use sunscreen. Apply sunscreen liberally and repeatedly throughout the day.  Protect yourself by wearing long sleeves, pants, a wide-brimmed hat, and sunglasses whenever you are outside. HEART DISEASE, DIABETES, AND HIGH BLOOD PRESSURE   Have your blood pressure checked at least every 1-2 years. High blood pressure causes heart disease and increases the risk of stroke.  If you are between 43 years and 66 years old, ask your health care provider if you should take aspirin to prevent  strokes.  Have regular diabetes screenings. This involves taking a blood sample to check your fasting blood sugar level.  If you are at a normal weight and have a low risk for diabetes, have this test once every three years after 67 years of age.  If you are overweight and have a high risk for diabetes, consider being tested at a younger age or more often. PREVENTING INFECTION  Hepatitis B  If you have a higher risk for hepatitis B, you should be screened for this virus. You are considered at high risk for hepatitis B if:  You were born in a country where hepatitis B is common. Ask your health care provider which countries are considered high risk.  Your parents were born in a high-risk country, and you have not been immunized against hepatitis B (hepatitis B vaccine).  You have HIV or AIDS.  You use needles to inject street drugs.  You live with someone who has hepatitis B.  You have had sex with someone who has hepatitis B.  You get hemodialysis treatment.  You take certain medicines for conditions, including cancer, organ transplantation, and autoimmune conditions. Hepatitis C  Blood testing is recommended for:  Everyone born from [birth year] through 1963.  Anyone with known risk factors for hepatitis C. Sexually transmitted infections (STIs)  You should be screened for sexually transmitted infections (STIs) including gonorrhea and chlamydia if:  You are sexually active and are younger than 67 years of age.  You are older than 67 years of age and your health care provider tells you that you are at risk for this type of infection.  Your sexual activity has changed since you were last screened and you are at an increased risk for chlamydia or gonorrhea. Ask your health care provider if you are at risk.  If you do not have HIV, but are at risk, it may be recommended that you take a prescription medicine daily to prevent HIV infection. This is called pre-exposure prophylaxis  (PrEP). You are considered at risk if:  You are sexually active and do not regularly use condoms or know the HIV status of your partner(s).  You take drugs by injection.  You are sexually active with a partner who has HIV. Talk with your health care provider  about whether you are at high risk of being infected with HIV. If you choose to begin PrEP, you should first be tested for HIV. You should then be tested every 3 months for as long as you are taking PrEP.  PREGNANCY   If you are premenopausal and you may become pregnant, ask your health care provider about preconception counseling.  If you may become pregnant, take 400 to 800 micrograms (mcg) of folic acid every day.  If you want to prevent pregnancy, talk to your health care provider about birth control (contraception). OSTEOPOROSIS AND MENOPAUSE   Osteoporosis is a disease in which the bones lose minerals and strength with aging. This can result in serious bone fractures. Your risk for osteoporosis can be identified using a bone density scan.  If you are 5 years of age or older, or if you are at risk for osteoporosis and fractures, ask your health care provider if you should be screened.  Ask your health care provider whether you should take a calcium or vitamin D supplement to lower your risk for osteoporosis.  Menopause may have certain physical symptoms and risks.  Hormone replacement therapy may reduce some of these symptoms and risks. Talk to your health care provider about whether hormone replacement therapy is right for you.  HOME CARE INSTRUCTIONS   Schedule regular health, dental, and eye exams.  Stay current with your immunizations.   Do not use any tobacco products including cigarettes, chewing tobacco, or electronic cigarettes.  If you are pregnant, do not drink alcohol.  If you are breastfeeding, limit how much and how often you drink alcohol.  Limit alcohol intake to no more than 1 drink per day for  nonpregnant women. One drink equals 12 ounces of beer, 5 ounces of wine, or 1 ounces of hard liquor.  Do not use street drugs.  Do not share needles.  Ask your health care provider for help if you need support or information about quitting drugs.  Tell your health care provider if you often feel depressed.  Tell your health care provider if you have ever been abused or do not feel safe at home. Document Released: 06/11/2011 Document Revised: 04/12/2014 Document Reviewed: 10/28/2013 [facility] Patient Information 2015 [place], [place]. This information is not intended to replace advice given to you by your health care provider. Make sure you discuss any questions you have with your health care provider.

## 2015-04-13 NOTE — Progress Notes (Signed)
   Subjective:    Patient ID: Jasmine Pham, female    DOB: Nov 20, 1946, 69 y.o.   MRN: [id number]  HPI Here for medicare wellness, no new complaints. Please see A/P for status and treatment of chronic medical problems.   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 The patient's medical and social history - reviewed today no changes 2. Their use of alcohol, tobacco or illicit drugs 3. Their current medications and supplements 4. The patient's functional ability including ADL's, fall risks, home safety risks and hearing or visual impairment. 5. Diet and physical activities 6. Evidence for depression or mood disorders 7. Care team reviewed and updated (available in snapshot)   Review of Systems  Constitutional: Negative for fever, activity change, appetite change, fatigue and unexpected weight change.  HENT: Negative.   Eyes: Negative.   Respiratory: Negative for cough, chest tightness, shortness of breath and wheezing.   Cardiovascular: Negative for chest pain, palpitations and leg swelling.  Gastrointestinal: Negative for nausea, abdominal pain, diarrhea, constipation and abdominal distention.  Musculoskeletal: Negative.   Skin: Negative.   Neurological: Negative.   Psychiatric/Behavioral: Negative.       Objective:   Physical Exam  Constitutional: She is oriented to person, place, and time. She appears well-developed and well-nourished.  HENT:  Head: Normocephalic and atraumatic.  Right Ear: External ear normal.  Left Ear: External ear normal.  Mouth/Throat: Oropharynx is  clear and moist.  Eyes: EOM are normal.  Neck: Normal range of motion.  Cardiovascular: Normal rate and regular rhythm.   Carotids without bruit bilaterally  Pulmonary/Chest: Effort normal and breath sounds normal.  Abdominal: Soft. She exhibits no distension. There is no tenderness.  Musculoskeletal: She exhibits no edema.  Neurological: She is alert and oriented to person, place, and time. Coordination normal.  Skin: Skin is warm and dry.  Psychiatric: She has a normal mood and affect.   Filed Vitals:   04/13/15 1101  BP: 132/76  Pulse: 61  Temp: 97.1 F (36.2 C)  TempSrc: Oral  Resp: 16  Height: 5\' 1"  (1.549 m)  Weight: 107 lb 12.8 oz (48.898 kg)  SpO2: 97%      Assessment & Plan:  Prevnar given at visit.

## 2015-04-13 NOTE — Assessment & Plan Note (Signed)
Prevnar given today to complete pneumonia series, up to date on shingles and flu. She is up to date on tetanus. Colonoscopy up to date. Mammogram up to date. Dexa due next year. 10 year screening recommendations given to the patient at visit. Talked with her about noise and hearing to preserve her remaining hearing. Also talked to her about sun safety and protection with clothing and sunscreen.

## 2015-04-13 NOTE — Assessment & Plan Note (Signed)
No need for medication at this time. Checking iron levels today and if low will supplement to see if this helps with symptoms.

## 2015-04-15 ENCOUNTER — Other Ambulatory Visit: Payer: Self-pay

## 2015-04-15 DIAGNOSIS — Z1231 Encounter for screening mammogram for malignant neoplasm of breast: Secondary | ICD-10-CM

## 2015-05-03 ENCOUNTER — Ambulatory Visit
Admission: RE | Admit: 2015-05-03 | Discharge: 2015-05-03 | Disposition: A | Discharge status: Home / Self Care | Payer: Medicare Other | Source: Ambulatory Visit

## 2015-05-03 DIAGNOSIS — Z1231 Encounter for screening mammogram for malignant neoplasm of breast: Secondary | ICD-10-CM

## 2015-12-01 ENCOUNTER — Encounter: Payer: Self-pay | Admitting: Geriatric Medicine

## 2016-05-04 ENCOUNTER — Other Ambulatory Visit: Payer: Self-pay | Admitting: Internal Medicine

## 2016-06-22 ENCOUNTER — Other Ambulatory Visit: Payer: Self-pay | Admitting: Internal Medicine

## 2016-06-22 DIAGNOSIS — Z1231 Encounter for screening mammogram for malignant neoplasm of breast: Secondary | ICD-10-CM

## 2016-06-25 ENCOUNTER — Ambulatory Visit: Payer: Medicare Other

## 2016-06-28 ENCOUNTER — Ambulatory Visit
Admission: RE | Admit: 2016-06-28 | Discharge: 2016-06-28 | Disposition: A | Discharge status: Home / Self Care | Payer: Medicare Other | Source: Ambulatory Visit | Attending: Internal Medicine | Admitting: Internal Medicine

## 2016-06-28 DIAGNOSIS — Z1231 Encounter for screening mammogram for malignant neoplasm of breast: Secondary | ICD-10-CM

## 2016-07-23 ENCOUNTER — Other Ambulatory Visit: Payer: Self-pay | Admitting: Internal Medicine

## 2016-08-02 ENCOUNTER — Other Ambulatory Visit (INDEPENDENT_AMBULATORY_CARE_PROVIDER_SITE_OTHER): Payer: Medicare Other

## 2016-08-02 ENCOUNTER — Encounter: Payer: Self-pay | Admitting: Internal Medicine

## 2016-08-02 ENCOUNTER — Ambulatory Visit (INDEPENDENT_AMBULATORY_CARE_PROVIDER_SITE_OTHER): Payer: Medicare Other | Admitting: Internal Medicine

## 2016-08-02 VITALS — BP 128/72 | HR 59 | Temp 98.5°F | Resp 12 | Ht 61.0 in | Wt 106.0 lb

## 2016-08-02 DIAGNOSIS — Z1159 Encounter for screening for other viral diseases: Secondary | ICD-10-CM

## 2016-08-02 DIAGNOSIS — Z0000 Encounter for general adult medical examination without abnormal findings: Secondary | ICD-10-CM

## 2016-08-02 DIAGNOSIS — H8102 Meniere's disease, left ear: Secondary | ICD-10-CM | POA: Diagnosis not present

## 2016-08-02 DIAGNOSIS — Z23 Encounter for immunization: Secondary | ICD-10-CM

## 2016-08-02 DIAGNOSIS — E785 Hyperlipidemia, unspecified: Secondary | ICD-10-CM

## 2016-08-02 LAB — COMPREHENSIVE METABOLIC PANEL
ALBUMIN: 4.4 g/dL (ref 3.5–5.2)
ALK PHOS: 76 U/L (ref 39–117)
ALT: 17 U/L (ref 0–35)
AST: 24 U/L (ref 0–37)
BILIRUBIN TOTAL: 1.5 mg/dL — AB (ref 0.2–1.2)
BUN: 12 mg/dL (ref 6–23)
CO2: 33 mEq/L — ABNORMAL HIGH (ref 19–32)
Calcium: 9.1 mg/dL (ref 8.4–10.5)
Chloride: 103 mEq/L (ref 96–112)
Creatinine, Ser: 0.7 mg/dL (ref 0.40–1.20)
GFR: 88.37 mL/min (ref >60.00)
Glucose, Bld: 87 mg/dL (ref 70–99)
POTASSIUM: 4 meq/L (ref 3.5–5.1)
SODIUM: 142 meq/L (ref 135–145)
TOTAL PROTEIN: 6.8 g/dL (ref 6.0–8.3)

## 2016-08-02 LAB — CBC
HEMATOCRIT: 42.1 % (ref 36.0–46.0)
HEMOGLOBIN: 14.6 g/dL (ref 12.0–15.0)
MCHC: 34.7 g/dL (ref 30.0–36.0)
MCV: 91.6 fl (ref 78.0–100.0)
PLATELETS: 271 10*3/uL (ref 150.0–400.0)
RBC: 4.6 Mil/uL (ref 3.87–5.11)
RDW: 13.3 % (ref 11.5–15.5)
WBC: 4.8 10*3/uL (ref 4.0–10.5)

## 2016-08-02 LAB — LIPID PANEL
CHOLESTEROL: 283 mg/dL — AB (ref 0–200)
HDL: 83.7 mg/dL (ref >39.00)
LDL Cholesterol: 173 mg/dL — ABNORMAL HIGH (ref 0–99)
NonHDL: 199.3
Total CHOL/HDL Ratio: 3
Triglycerides: 131 mg/dL (ref 0.0–149.0)
VLDL: 26.2 mg/dL (ref 0.0–40.0)

## 2016-08-02 MED ORDER — ROSUVASTATIN CALCIUM 5 MG PO TABS: 5.0000 mg | ORAL_TABLET | Freq: Every day | ORAL | 3 refills | Status: DC

## 2016-08-02 NOTE — Assessment & Plan Note (Signed)
Still with damage to the left ear, no dizziness currently.

## 2016-08-02 NOTE — Assessment & Plan Note (Signed)
Taking crestor 5 mg daily and adjust as needed after results on lipid panel.

## 2016-08-02 NOTE — Progress Notes (Signed)
Pre visit review using our clinic review tool, if applicable. No additional management support is needed unless otherwise documented below in the visit note. 

## 2016-08-02 NOTE — Patient Instructions (Signed)
We will check the blood work today and send the results on mychart.   Make sure to ask the gynecologist about the bone density scan.   Health Maintenance, Female Adopting a healthy lifestyle and getting preventive care can go a long way to promote health and wellness. Talk with your health care provider about what schedule of regular examinations is right for you. This is a good chance for you to check in with your provider about disease prevention and staying healthy. In between checkups, there are plenty of things you can do on your own. Experts have done a lot of research about which lifestyle changes and preventive measures are most likely to keep you healthy. Ask your health care provider for more information. WEIGHT AND DIET  Eat a healthy diet  Be sure to include plenty of vegetables, fruits, low-fat dairy products, and lean protein.  Do not eat a lot of foods high in solid fats, added sugars, or salt.  Get regular exercise. This is one of the most important things you can do for your health.  Most adults should exercise for at least 150 minutes each week. The exercise should increase your heart rate and make you sweat (moderate-intensity exercise).  Most adults should also do strengthening exercises at least twice a week. This is in addition to the moderate-intensity exercise.  Maintain a healthy weight  Body mass index (BMI) is a measurement that can be used to identify possible weight problems. It estimates body fat based on height and weight. Your health care provider can help determine your BMI and help you achieve or maintain a healthy weight.  For females 68 years of age and older:   A BMI below 18.5 is considered underweight.  A BMI of 18.5 to 24.9 is normal.  A BMI of 25 to 29.9 is considered overweight.  A BMI of 30 and above is considered obese.  Watch levels of cholesterol and blood lipids  You should start having your blood tested for lipids and cholesterol at  68 years of age, then have this test every 5 years.  You may need to have your cholesterol levels checked more often if:  Your lipid or cholesterol levels are high.  You are older than 69 years of age.  You are at high risk for heart disease.  CANCER SCREENING   Lung Cancer  Lung cancer screening is recommended for adults 56-77 years old who are at high risk for lung cancer because of a history of smoking.  A yearly low-dose CT scan of the lungs is recommended for people who:  Currently smoke.  Have quit within the past 15 years.  Have at least a 30-pack-year history of smoking. A pack year is smoking an average of one pack of cigarettes a day for 1 year.  Yearly screening should continue until it has been 15 years since you quit.  Yearly screening should stop if you develop a health problem that would prevent you from having lung cancer treatment.  Breast Cancer  Practice breast self-awareness. This means understanding how your breasts normally appear and feel.  It also means doing regular breast self-exams. Let your health care provider know about any changes, no matter how small.  If you are in your 20s or 30s, you should have a clinical breast exam (CBE) by a health care provider every 1-3 years as part of a regular health exam.  If you are 76 or older, have a CBE every year. Also consider having  a breast X-ray (mammogram) every year.  If you have a family history of breast cancer, talk to your health care provider about genetic screening.  If you are at high risk for breast cancer, talk to your health care provider about having an MRI and a mammogram every year.  Breast cancer gene (BRCA) assessment is recommended for women who have family members with BRCA-related cancers. BRCA-related cancers include:  Breast.  Ovarian.  Tubal.  Peritoneal cancers.  Results of the assessment will determine the need for genetic counseling and BRCA1 and BRCA2  testing. Cervical Cancer Your health care provider may recommend that you be screened regularly for cancer of the pelvic organs (ovaries, uterus, and vagina). This screening involves a pelvic examination, including checking for microscopic changes to the surface of your cervix (Pap test). You may be encouraged to have this screening done every 3 years, beginning at age 21.  For women ages 30-65, health care providers may recommend pelvic exams and Pap testing every 3 years, or they may recommend the Pap and pelvic exam, combined with testing for human papilloma virus (HPV), every 5 years. Some types of HPV increase your risk of cervical cancer. Testing for HPV may also be done on women of any age with unclear Pap test results.  Other health care providers may not recommend any screening for nonpregnant women who are considered low risk for pelvic cancer and who do not have symptoms. Ask your health care provider if a screening pelvic exam is right for you.  If you have had past treatment for cervical cancer or a condition that could lead to cancer, you need Pap tests and screening for cancer for at least 20 years after your treatment. If Pap tests have been discontinued, your risk factors (such as having a new sexual partner) need to be reassessed to determine if screening should resume. Some women have medical problems that increase the chance of getting cervical cancer. In these cases, your health care provider may recommend more frequent screening and Pap tests. Colorectal Cancer  This type of cancer can be detected and often prevented.  Routine colorectal cancer screening usually begins at 68 years of age and continues through 68 years of age.  Your health care provider may recommend screening at an earlier age if you have risk factors for colon cancer.  Your health care provider may also recommend using home test kits to check for hidden blood in the stool.  A small camera at the end of a  tube can be used to examine your colon directly (sigmoidoscopy or colonoscopy). This is done to check for the earliest forms of colorectal cancer.  Routine screening usually begins at age 50.  Direct examination of the colon should be repeated every 5-10 years through 68 years of age. However, you may need to be screened more often if early forms of precancerous polyps or small growths are found. Skin Cancer  Check your skin from head to toe regularly.  Tell your health care provider about any new moles or changes in moles, especially if there is a change in a mole's shape or color.  Also tell your health care provider if you have a mole that is larger than the size of a pencil eraser.  Always use sunscreen. Apply sunscreen liberally and repeatedly throughout the day.  Protect yourself by wearing long sleeves, pants, a wide-brimmed hat, and sunglasses whenever you are outside. HEART DISEASE, DIABETES, AND HIGH BLOOD PRESSURE   High blood pressure   causes heart disease and increases the risk of stroke. High blood pressure is more likely to develop in:  People who have blood pressure in the high end of the normal range (130-139/85-89 mm Hg).  People who are overweight or obese.  People who are African American.  If you are 18-39 years of age, have your blood pressure checked every 3-5 years. If you are 40 years of age or older, have your blood pressure checked every year. You should have your blood pressure measured twice--once when you are at a hospital or clinic, and once when you are not at a hospital or clinic. Record the average of the two measurements. To check your blood pressure when you are not at a hospital or clinic, you can use:  An automated blood pressure machine at a pharmacy.  A home blood pressure monitor.  If you are between 55 years and 79 years old, ask your health care provider if you should take aspirin to prevent strokes.  Have regular diabetes screenings. This  involves taking a blood sample to check your fasting blood sugar level.  If you are at a normal weight and have a low risk for diabetes, have this test once every three years after 68 years of age.  If you are overweight and have a high risk for diabetes, consider being tested at a younger age or more often. PREVENTING INFECTION  Hepatitis B  If you have a higher risk for hepatitis B, you should be screened for this virus. You are considered at high risk for hepatitis B if:  You were born in a country where hepatitis B is common. Ask your health care provider which countries are considered high risk.  Your parents were born in a high-risk country, and you have not been immunized against hepatitis B (hepatitis B vaccine).  You have HIV or AIDS.  You use needles to inject street drugs.  You live with someone who has hepatitis B.  You have had sex with someone who has hepatitis B.  You get hemodialysis treatment.  You take certain medicines for conditions, including cancer, organ transplantation, and autoimmune conditions. Hepatitis C  Blood testing is recommended for:  Everyone born from 1945 through 1965.  Anyone with known risk factors for hepatitis C. Sexually transmitted infections (STIs)  You should be screened for sexually transmitted infections (STIs) including gonorrhea and chlamydia if:  You are sexually active and are younger than 68 years of age.  You are older than 68 years of age and your health care provider tells you that you are at risk for this type of infection.  Your sexual activity has changed since you were last screened and you are at an increased risk for chlamydia or gonorrhea. Ask your health care provider if you are at risk.  If you do not have HIV, but are at risk, it may be recommended that you take a prescription medicine daily to prevent HIV infection. This is called pre-exposure prophylaxis (PrEP). You are considered at risk if:  You are  sexually active and do not regularly use condoms or know the HIV status of your partner(s).  You take drugs by injection.  You are sexually active with a partner who has HIV. Talk with your health care provider about whether you are at high risk of being infected with HIV. If you choose to begin PrEP, you should first be tested for HIV. You should then be tested every 3 months for as long as you   are taking PrEP.  PREGNANCY   If you are premenopausal and you may become pregnant, ask your health care provider about preconception counseling.  If you may become pregnant, take 400 to 800 micrograms (mcg) of folic acid every day.  If you want to prevent pregnancy, talk to your health care provider about birth control (contraception). OSTEOPOROSIS AND MENOPAUSE   Osteoporosis is a disease in which the bones lose minerals and strength with aging. This can result in serious bone fractures. Your risk for osteoporosis can be identified using a bone density scan.  If you are 65 years of age or older, or if you are at risk for osteoporosis and fractures, ask your health care provider if you should be screened.  Ask your health care provider whether you should take a calcium or vitamin D supplement to lower your risk for osteoporosis.  Menopause may have certain physical symptoms and risks.  Hormone replacement therapy may reduce some of these symptoms and risks. Talk to your health care provider about whether hormone replacement therapy is right for you.  HOME CARE INSTRUCTIONS   Schedule regular health, dental, and eye exams.  Stay current with your immunizations.   Do not use any tobacco products including cigarettes, chewing tobacco, or electronic cigarettes.  If you are pregnant, do not drink alcohol.  If you are breastfeeding, limit how much and how often you drink alcohol.  Limit alcohol intake to no more than 1 drink per day for nonpregnant women. One drink equals 12 ounces of beer, 5  ounces of wine, or 1 ounces of hard liquor.  Do not use street drugs.  Do not share needles.  Ask your health care provider for help if you need support or information about quitting drugs.  Tell your health care provider if you often feel depressed.  Tell your health care provider if you have ever been abused or do not feel safe at home.   This information is not intended to replace advice given to you by your health care provider. Make sure you discuss any questions you have with your health care provider.   Document Released: 06/11/2011 Document Revised: 12/17/2014 Document Reviewed: 10/28/2013 Elsevier Interactive Patient Education 2016 [employer]  

## 2016-08-02 NOTE — Assessment & Plan Note (Signed)
Tdap given at visit to update immunizations. Wants to wait a couple of months for the flu shot. Colonoscopy due in 2024. Dexa due this year and she gets down at her gyn (going to see them in about 1 month). Counseled about sun safety/mole surveillance and the dangers of distracted driving. Given 10 year screening recommendations.

## 2016-08-02 NOTE — Progress Notes (Signed)
   Subjective:    Patient ID: Jasmine Pham, female    DOB: [birth year], 70 y.o.   MRN: [id number]  HPI Here for medicare wellness as well as CPE, no new complaints. Please see A/P for status and treatment of chronic medical problems.   Diet: heart healthy Physical activity: walks daily Depression/mood screen: negative Hearing: intact to whispered voice right, left hearing loss severe Visual acuity: grossly normal, performs annual eye exam  ADLs: capable Fall risk: none Home safety: good Cognitive evaluation: intact to orientation, naming, recall and repetition EOL planning: adv directives discussed  I have personally reviewed and have noted 1. The patient's medical and social history - reviewed today no changes 2. Their use of alcohol, tobacco or illicit drugs 3. Their current medications and supplements 4. The patient's functional ability including ADL's, fall risks, home safety risks and hearing or visual impairment. 5. Diet and physical activities 6. Evidence for depression or mood disorders 7. Care team reviewed and updated (available in snapshot)  Review of Systems  Constitutional: Negative for activity change, appetite change, fatigue, fever and unexpected weight change.  HENT: Negative.   Eyes: Negative.   Respiratory: Negative for cough, chest tightness, shortness of breath and wheezing.   Cardiovascular: Negative for chest pain, palpitations and leg swelling.  Gastrointestinal: Negative for abdominal distention, abdominal pain, constipation, diarrhea and nausea.  Musculoskeletal: Negative.   Skin: Negative.   Neurological: Negative.   Psychiatric/Behavioral: Negative.       Objective:   Physical Exam  Constitutional: She is oriented to person, place, and time. She appears well-developed and well-nourished.  HENT:  Head: Normocephalic and atraumatic.  Right Ear: External ear normal.  Left Ear: External ear normal.  Mouth/Throat: Oropharynx is clear and moist.    Eyes: EOM are normal.  Neck: Normal range of motion.  Cardiovascular: Normal rate and regular rhythm.   Carotids without bruit bilaterally  Pulmonary/Chest: Effort normal and breath sounds normal.  Abdominal: Soft. Bowel sounds are normal. She exhibits no distension. There is no tenderness. There is no rebound.  Musculoskeletal: She exhibits no edema.  Neurological: She is alert and oriented to person, place, and time. Coordination normal.  Skin: Skin is warm and dry.  Psychiatric: She has a normal mood and affect.   Vitals:   08/02/16 1058  BP: 128/72  Pulse: (!) 59  Resp: 12  Temp: 98.5 F (36.9 C)  TempSrc: Oral  SpO2: 98%  Weight: 106 lb (48.1 kg)  Height: 5\' 1"  (1.549 m)      Assessment & Plan:  Tdap given at visit.

## 2016-08-03 LAB — HEPATITIS C ANTIBODY: HCV Ab: NEGATIVE

## 2016-08-09 ENCOUNTER — Encounter: Payer: Self-pay | Admitting: Internal Medicine

## 2016-10-24 ENCOUNTER — Encounter: Payer: Self-pay | Admitting: Geriatric Medicine

## 2017-08-15 ENCOUNTER — Ambulatory Visit (INDEPENDENT_AMBULATORY_CARE_PROVIDER_SITE_OTHER): Payer: Medicare Other | Admitting: Internal Medicine

## 2017-08-15 ENCOUNTER — Other Ambulatory Visit (INDEPENDENT_AMBULATORY_CARE_PROVIDER_SITE_OTHER): Payer: Medicare Other

## 2017-08-15 ENCOUNTER — Encounter: Payer: Self-pay | Admitting: Internal Medicine

## 2017-08-15 VITALS — BP 140/80 | HR 65 | Temp 98.6°F | Ht 61.0 in | Wt 106.0 lb

## 2017-08-15 DIAGNOSIS — G2581 Restless legs syndrome: Secondary | ICD-10-CM

## 2017-08-15 DIAGNOSIS — Z0000 Encounter for general adult medical examination without abnormal findings: Secondary | ICD-10-CM | POA: Diagnosis not present

## 2017-08-15 DIAGNOSIS — E785 Hyperlipidemia, unspecified: Secondary | ICD-10-CM | POA: Diagnosis not present

## 2017-08-15 LAB — LIPID PANEL
CHOL/HDL RATIO: 3
Cholesterol: 243 mg/dL — ABNORMAL HIGH (ref 0–200)
HDL: 91.8 mg/dL (ref >39.00)
LDL CALC: 136 mg/dL — AB (ref 0–99)
NonHDL: 151.55
TRIGLYCERIDES: 76 mg/dL (ref 0.0–149.0)
VLDL: 15.2 mg/dL (ref 0.0–40.0)

## 2017-08-15 LAB — CBC
HEMATOCRIT: 41.5 % (ref 36.0–46.0)
Hemoglobin: 14.3 g/dL (ref 12.0–15.0)
MCHC: 34.5 g/dL (ref 30.0–36.0)
MCV: 92.3 fl (ref 78.0–100.0)
PLATELETS: 234 10*3/uL (ref 150.0–400.0)
RBC: 4.49 Mil/uL (ref 3.87–5.11)
RDW: 13.1 % (ref 11.5–15.5)
WBC: 5.9 10*3/uL (ref 4.0–10.5)

## 2017-08-15 LAB — COMPREHENSIVE METABOLIC PANEL
ALT: 21 U/L (ref 0–35)
AST: 27 U/L (ref 0–37)
Albumin: 4.2 g/dL (ref 3.5–5.2)
Alkaline Phosphatase: 93 U/L (ref 39–117)
BUN: 11 mg/dL (ref 6–23)
CALCIUM: 9.4 mg/dL (ref 8.4–10.5)
CHLORIDE: 103 meq/L (ref 96–112)
CO2: 32 meq/L (ref 19–32)
Creatinine, Ser: 0.69 mg/dL (ref 0.40–1.20)
GFR: 89.58 mL/min (ref >60.00)
Glucose, Bld: 84 mg/dL (ref 70–99)
Potassium: 4.1 mEq/L (ref 3.5–5.1)
Sodium: 141 mEq/L (ref 135–145)
Total Bilirubin: 1.3 mg/dL — ABNORMAL HIGH (ref 0.2–1.2)
Total Protein: 6.7 g/dL (ref 6.0–8.3)

## 2017-08-15 LAB — FERRITIN: Ferritin: 64.9 ng/mL (ref 10.0–291.0)

## 2017-08-15 NOTE — Progress Notes (Signed)
   Subjective:    Patient ID: Jasmine Pham, female    DOB: 13-Apr-1946, 71 y.o.   MRN: [id number]  HPI Here for medicare wellness as well as CPE, no new complaints. Please see A/P for status and treatment of chronic medical problems.   Diet: heart healthy Physical activity: walks daily Depression/mood screen: negative Hearing: intact to whispered voice Visual acuity: grossly normal, performs annual eye exam  ADLs: capable Fall risk: none Home safety: good Cognitive evaluation: intact to orientation, naming, recall and repetition EOL planning: adv directives discussed, not in place  I have personally reviewed and have noted 1. The patient's medical and social history - reviewed today no changes 2. Their use of alcohol, tobacco or illicit drugs 3. Their current medications and supplements 4. The patient's functional ability including ADL's, fall risks, home safety risks and hearing or visual impairment. 5. Diet and physical activities 6. Evidence for depression or mood disorders 7. Care team reviewed and updated (available in snapshot)  Review of Systems  Constitutional: Negative.   HENT: Negative.   Eyes: Negative.   Respiratory: Negative for cough, chest tightness and shortness of breath.   Cardiovascular: Negative for chest pain, palpitations and leg swelling.  Gastrointestinal: Negative for abdominal distention, abdominal pain, constipation, diarrhea, nausea and vomiting.  Musculoskeletal: Negative.   Skin: Negative.   Neurological: Negative.   Psychiatric/Behavioral: Negative.       Objective:   Physical Exam  Constitutional: She is oriented to person, place, and time. She appears well-developed and well-nourished.  HENT:  Head: Normocephalic and atraumatic.  Eyes: EOM are normal.  Neck: Normal range of motion.  Cardiovascular: Normal rate and regular rhythm.   Pulmonary/Chest: Effort normal and breath sounds normal. No respiratory distress. She has no wheezes. She  has no rales.  Abdominal: Soft. Bowel sounds are normal. She exhibits no distension. There is no tenderness. There is no rebound.  Musculoskeletal: She exhibits no edema.  Neurological: She is alert and oriented to person, place, and time. Coordination normal.  Skin: Skin is warm and dry.  Psychiatric: She has a normal mood and affect.   Vitals:   08/15/17 1302  BP: 140/80  Pulse: 65  Temp: 98.6 F (37 C)  TempSrc: Oral  SpO2: 100%  Weight: 106 lb (48.1 kg)  Height: 5\' 1"  (1.549 m)   EKG: Rate 62, axis normal, intervals normal, sinus, RSR not new, no st or t wave changes, when compared to 2013 no significant changes.     Assessment & Plan:

## 2017-08-15 NOTE — Patient Instructions (Signed)
We are checking the labs today and will send the results on mychart.   The EKG of the heart is normal and not changed from last time.   Health Maintenance, Female Adopting a healthy lifestyle and getting preventive care can go a long way to promote health and wellness. Talk with your health care provider about what schedule of regular examinations is right for you. This is a good chance for you to check in with your provider about disease prevention and staying healthy. In between checkups, there are plenty of things you can do on your own. Experts have done a lot of research about which lifestyle changes and preventive measures are most likely to keep you healthy. Ask your health care provider for more information. Weight and diet Eat a healthy diet  Be sure to include plenty of vegetables, fruits, low-fat dairy products, and lean protein.  Do not eat a lot of foods high in solid fats, added sugars, or salt.  Get regular exercise. This is one of the most important things you can do for your health. ? Most adults should exercise for at least 150 minutes each week. The exercise should increase your heart rate and make you sweat (moderate-intensity exercise). ? Most adults should also do strengthening exercises at least twice a week. This is in addition to the moderate-intensity exercise.  Maintain a healthy weight  Body mass index (BMI) is a measurement that can be used to identify possible weight problems. It estimates body fat based on height and weight. Your health care provider can help determine your BMI and help you achieve or maintain a healthy weight.  For females 78 years of age and older: ? A BMI below 18.5 is considered underweight. ? A BMI of 18.5 to 24.9 is normal. ? A BMI of 25 to 29.9 is considered overweight. ? A BMI of 30 and above is considered obese.  Watch levels of cholesterol and blood lipids  You should start having your blood tested for lipids and cholesterol at  69 years of age, then have this test every 5 years.  You may need to have your cholesterol levels checked more often if: ? Your lipid or cholesterol levels are high. ? You are older than 69 years of age. ? You are at high risk for heart disease.  Cancer screening Lung Cancer  Lung cancer screening is recommended for adults 59-35 years old who are at high risk for lung cancer because of a history of smoking.  A yearly low-dose CT scan of the lungs is recommended for people who: ? Currently smoke. ? Have quit within the past 15 years. ? Have at least a 30-pack-year history of smoking. A pack year is smoking an average of one pack of cigarettes a day for 1 year.  Yearly screening should continue until it has been 15 years since you quit.  Yearly screening should stop if you develop a health problem that would prevent you from having lung cancer treatment.  Breast Cancer  Practice breast self-awareness. This means understanding how your breasts normally appear and feel.  It also means doing regular breast self-exams. Let your health care provider know about any changes, no matter how small.  If you are in your 20s or 30s, you should have a clinical breast exam (CBE) by a health care provider every 1-3 years as part of a regular health exam.  If you are 40 or older, have a CBE every year. Also consider having a breast  X-ray (mammogram) every year.  If you have a family history of breast cancer, talk to your health care provider about genetic screening.  If you are at high risk for breast cancer, talk to your health care provider about having an MRI and a mammogram every year.  Breast cancer gene (BRCA) assessment is recommended for women who have family members with BRCA-related cancers. BRCA-related cancers include: ? Breast. ? Ovarian. ? Tubal. ? Peritoneal cancers.  Results of the assessment will determine the need for genetic counseling and BRCA1 and BRCA2 testing.  Cervical  Cancer Your health care provider may recommend that you be screened regularly for cancer of the pelvic organs (ovaries, uterus, and vagina). This screening involves a pelvic examination, including checking for microscopic changes to the surface of your cervix (Pap test). You may be encouraged to have this screening done every 3 years, beginning at age 78.  For women ages 57-65, health care providers may recommend pelvic exams and Pap testing every 3 years, or they may recommend the Pap and pelvic exam, combined with testing for human papilloma virus (HPV), every 5 years. Some types of HPV increase your risk of cervical cancer. Testing for HPV may also be done on women of any age with unclear Pap test results.  Other health care providers may not recommend any screening for nonpregnant women who are considered low risk for pelvic cancer and who do not have symptoms. Ask your health care provider if a screening pelvic exam is right for you.  If you have had past treatment for cervical cancer or a condition that could lead to cancer, you need Pap tests and screening for cancer for at least 20 years after your treatment. If Pap tests have been discontinued, your risk factors (such as having a new sexual partner) need to be reassessed to determine if screening should resume. Some women have medical problems that increase the chance of getting cervical cancer. In these cases, your health care provider may recommend more frequent screening and Pap tests.  Colorectal Cancer  This type of cancer can be detected and often prevented.  Routine colorectal cancer screening usually begins at 69 years of age and continues through 69 years of age.  Your health care provider may recommend screening at an earlier age if you have risk factors for colon cancer.  Your health care provider may also recommend using home test kits to check for hidden blood in the stool.  A small camera at the end of a tube can be used to  examine your colon directly (sigmoidoscopy or colonoscopy). This is done to check for the earliest forms of colorectal cancer.  Routine screening usually begins at age 36.  Direct examination of the colon should be repeated every 5-10 years through 69 years of age. However, you may need to be screened more often if early forms of precancerous polyps or small growths are found.  Skin Cancer  Check your skin from head to toe regularly.  Tell your health care provider about any new moles or changes in moles, especially if there is a change in a mole's shape or color.  Also tell your health care provider if you have a mole that is larger than the size of a pencil eraser.  Always use sunscreen. Apply sunscreen liberally and repeatedly throughout the day.  Protect yourself by wearing long sleeves, pants, a wide-brimmed hat, and sunglasses whenever you are outside.  Heart disease, diabetes, and high blood pressure  High blood  pressure causes heart disease and increases the risk of stroke. High blood pressure is more likely to develop in: ? People who have blood pressure in the high end of the normal range (130-139/85-89 mm Hg). ? People who are overweight or obese. ? People who are African American.  If you are 73-13 years of age, have your blood pressure checked every 3-5 years. If you are 25 years of age or older, have your blood pressure checked every year. You should have your blood pressure measured twice-once when you are at a hospital or clinic, and once when you are not at a hospital or clinic. Record the average of the two measurements. To check your blood pressure when you are not at a hospital or clinic, you can use: ? An automated blood pressure machine at a pharmacy. ? A home blood pressure monitor.  If you are between 32 years and 64 years old, ask your health care provider if you should take aspirin to prevent strokes.  Have regular diabetes screenings. This involves taking a  blood sample to check your fasting blood sugar level. ? If you are at a normal weight and have a low risk for diabetes, have this test once every three years after 69 years of age. ? If you are overweight and have a high risk for diabetes, consider being tested at a younger age or more often. Preventing infection Hepatitis B  If you have a higher risk for hepatitis B, you should be screened for this virus. You are considered at high risk for hepatitis B if: ? You were born in a country where hepatitis B is common. Ask your health care provider which countries are considered high risk. ? Your parents were born in a high-risk country, and you have not been immunized against hepatitis B (hepatitis B vaccine). ? You have HIV or AIDS. ? You use needles to inject street drugs. ? You live with someone who has hepatitis B. ? You have had sex with someone who has hepatitis B. ? You get hemodialysis treatment. ? You take certain medicines for conditions, including cancer, organ transplantation, and autoimmune conditions.  Hepatitis C  Blood testing is recommended for: ? Everyone born from [birth year] through 1963. ? Anyone with known risk factors for hepatitis C.  Sexually transmitted infections (STIs)  You should be screened for sexually transmitted infections (STIs) including gonorrhea and chlamydia if: ? You are sexually active and are younger than 69 years of age. ? You are older than 69 years of age and your health care provider tells you that you are at risk for this type of infection. ? Your sexual activity has changed since you were last screened and you are at an increased risk for chlamydia or gonorrhea. Ask your health care provider if you are at risk.  If you do not have HIV, but are at risk, it may be recommended that you take a prescription medicine daily to prevent HIV infection. This is called pre-exposure prophylaxis (PrEP). You are considered at risk if: ? You are sexually active and  do not regularly use condoms or know the HIV status of your partner(s). ? You take drugs by injection. ? You are sexually active with a partner who has HIV.  Talk with your health care provider about whether you are at high risk of being infected with HIV. If you choose to begin PrEP, you should first be tested for HIV. You should then be tested every 3 months for as  long as you are taking PrEP. Pregnancy  If you are premenopausal and you may become pregnant, ask your health care provider about preconception counseling.  If you may become pregnant, take 400 to 800 micrograms (mcg) of folic acid every day.  If you want to prevent pregnancy, talk to your health care provider about birth control (contraception). Osteoporosis and menopause  Osteoporosis is a disease in which the bones lose minerals and strength with aging. This can result in serious bone fractures. Your risk for osteoporosis can be identified using a bone density scan.  If you are 59 years of age or older, or if you are at risk for osteoporosis and fractures, ask your health care provider if you should be screened.  Ask your health care provider whether you should take a calcium or vitamin D supplement to lower your risk for osteoporosis.  Menopause may have certain physical symptoms and risks.  Hormone replacement therapy may reduce some of these symptoms and risks. Talk to your health care provider about whether hormone replacement therapy is right for you. Follow these instructions at home:  Schedule regular health, dental, and eye exams.  Stay current with your immunizations.  Do not use any tobacco products including cigarettes, chewing tobacco, or electronic cigarettes.  If you are pregnant, do not drink alcohol.  If you are breastfeeding, limit how much and how often you drink alcohol.  Limit alcohol intake to no more than 1 drink per day for nonpregnant women. One drink equals 12 ounces of beer, 5 ounces of  wine, or 1 ounces of hard liquor.  Do not use street drugs.  Do not share needles.  Ask your health care provider for help if you need support or information about quitting drugs.  Tell your health care provider if you often feel depressed.  Tell your health care provider if you have ever been abused or do not feel safe at home. This information is not intended to replace advice given to you by your health care provider. Make sure you discuss any questions you have with your health care provider. Document Released: 06/11/2011 Document Revised: 05/03/2016 Document Reviewed: 08/30/2015 Elsevier Interactive Patient Education  [employer].

## 2017-08-15 NOTE — Assessment & Plan Note (Signed)
Checking lipid panel and CMP. Adjust crestor as needed. Checking EKG which is not changed from last 2013.

## 2017-08-15 NOTE — Assessment & Plan Note (Signed)
Checking ferritin and CBC for low iron as contributor for recent worsening. She declines medication.

## 2017-08-15 NOTE — Assessment & Plan Note (Signed)
Colonoscopy due in 2019, mammogram due July 2019, has had shingrix (both) and tdap up to date, pneumonia completed. Flu shot will get in November. Counseled on 10 year screening recommendations. Counseled about sun safety and mole surveillance as well as dangers of distracted driving.

## 2017-09-05 ENCOUNTER — Other Ambulatory Visit: Payer: Self-pay | Admitting: Internal Medicine

## 2017-09-05 DIAGNOSIS — Z1231 Encounter for screening mammogram for malignant neoplasm of breast: Secondary | ICD-10-CM

## 2017-09-18 ENCOUNTER — Ambulatory Visit
Admission: RE | Admit: 2017-09-18 | Discharge: 2017-09-18 | Disposition: A | Discharge status: Home / Self Care | Payer: Medicare Other | Source: Ambulatory Visit | Attending: Internal Medicine | Admitting: Internal Medicine

## 2017-09-18 DIAGNOSIS — Z1231 Encounter for screening mammogram for malignant neoplasm of breast: Secondary | ICD-10-CM

## 2017-12-10 HISTORY — PX: CATARACT EXTRACTION W/ INTRAOCULAR LENS IMPLANT: SHX1309

## 2017-12-10 HISTORY — PX: CATARACT EXTRACTION: SUR2

## 2017-12-18 ENCOUNTER — Other Ambulatory Visit: Payer: Self-pay | Admitting: Internal Medicine

## 2018-01-02 ENCOUNTER — Ambulatory Visit: Payer: Medicare Other | Admitting: Internal Medicine

## 2018-01-02 ENCOUNTER — Encounter: Payer: Self-pay | Admitting: Internal Medicine

## 2018-01-02 DIAGNOSIS — G2581 Restless legs syndrome: Secondary | ICD-10-CM

## 2018-01-02 MED ORDER — ROPINIROLE HCL 0.25 MG PO TABS: 0.2500 mg | ORAL_TABLET | Freq: Every day | ORAL | 6 refills | Status: DC

## 2018-01-02 NOTE — Progress Notes (Signed)
   Subjective:    Patient ID: Jasmine Pham, female    DOB: 1946/06/07, 72 y.o.   MRN: [id number]  HPI The patient is a 72 YO female coming in for restless legs and wanting to know about medication. She has had this for some time and has been evaluated for iron deficiency in the past (not present). She has declined medication in the past as this has been mild. She is now having more problems with this. In the last several weeks she has gotten symptoms in one leg and then the other which has caused her to lose a lot of sleep. She does not feel well after sleeping this poorly.   Review of Systems  Constitutional: Negative.   Respiratory: Negative for cough, chest tightness and shortness of breath.   Cardiovascular: Negative for chest pain, palpitations and leg swelling.  Gastrointestinal: Negative for abdominal distention, abdominal pain, constipation, diarrhea, nausea and vomiting.  Musculoskeletal: Negative.        Restless leg  Skin: Negative.   Neurological: Negative.   Psychiatric/Behavioral: Negative.       Objective:   Physical Exam  Constitutional: She is oriented to person, place, and time. She appears well-developed and well-nourished.  HENT:  Head: Normocephalic and atraumatic.  Eyes: EOM are normal.  Neck: Normal range of motion.  Cardiovascular: Normal rate and regular rhythm.  Pulmonary/Chest: Effort normal and breath sounds normal. No respiratory distress. She has no wheezes. She has no rales.  Abdominal: Soft. Bowel sounds are normal. She exhibits no distension. There is no tenderness. There is no rebound.  Musculoskeletal: She exhibits no edema.  Neurological: She is alert and oriented to person, place, and time. Coordination normal.  Skin: Skin is warm and dry.   Vitals:   01/02/18 1329  BP: 140/82  Pulse: 63  Temp: 97.9 F (36.6 C)  TempSrc: Oral  SpO2: 98%  Weight: 108 lb (49 kg)  Height: 5\' 1"  (1.549 m)      Assessment & Plan:

## 2018-01-02 NOTE — Patient Instructions (Signed)
We have sent in the requip that you can take in the evening if needed.

## 2018-01-02 NOTE — Assessment & Plan Note (Signed)
Rx for requip 0.25 mg qhs prn. She will let us know if this is adequate.

## 2018-05-01 ENCOUNTER — Encounter: Payer: Self-pay | Admitting: Internal Medicine

## 2018-05-01 MED ORDER — SCOPOLAMINE 1 MG/3DAYS TD PT72: 1.0000 | MEDICATED_PATCH | TRANSDERMAL | 0 refills | Status: DC

## 2018-05-08 ENCOUNTER — Encounter: Payer: Self-pay | Admitting: Internal Medicine

## 2018-07-10 HISTORY — PX: COLONOSCOPY: SHX5424

## 2018-07-27 DIAGNOSIS — Z86010 Personal history of colonic polyps: Secondary | ICD-10-CM | POA: Insufficient documentation

## 2018-07-29 LAB — HM COLONOSCOPY

## 2018-07-30 ENCOUNTER — Encounter: Payer: Self-pay | Admitting: Internal Medicine

## 2018-07-30 NOTE — Progress Notes (Signed)
Abstracted and sent to scan  

## 2018-07-31 ENCOUNTER — Other Ambulatory Visit: Payer: Self-pay | Admitting: Internal Medicine

## 2018-08-13 ENCOUNTER — Encounter: Payer: Self-pay | Admitting: Internal Medicine

## 2018-10-22 DIAGNOSIS — K648 Other hemorrhoids: Secondary | ICD-10-CM | POA: Insufficient documentation

## 2018-12-09 ENCOUNTER — Other Ambulatory Visit: Payer: Self-pay | Admitting: Internal Medicine

## 2018-12-09 DIAGNOSIS — Z1231 Encounter for screening mammogram for malignant neoplasm of breast: Secondary | ICD-10-CM

## 2019-01-08 ENCOUNTER — Ambulatory Visit
Admission: RE | Admit: 2019-01-08 | Discharge: 2019-01-08 | Disposition: A | Discharge status: Home / Self Care | Payer: Medicare Other | Source: Ambulatory Visit | Attending: Internal Medicine | Admitting: Internal Medicine

## 2019-01-08 DIAGNOSIS — Z1231 Encounter for screening mammogram for malignant neoplasm of breast: Secondary | ICD-10-CM

## 2019-01-27 ENCOUNTER — Encounter: Payer: Self-pay | Admitting: Internal Medicine

## 2019-01-27 ENCOUNTER — Other Ambulatory Visit (INDEPENDENT_AMBULATORY_CARE_PROVIDER_SITE_OTHER): Payer: Medicare Other

## 2019-01-27 ENCOUNTER — Ambulatory Visit (INDEPENDENT_AMBULATORY_CARE_PROVIDER_SITE_OTHER): Payer: Medicare Other | Admitting: Internal Medicine

## 2019-01-27 VITALS — BP 124/80 | HR 62 | Temp 97.8°F | Ht 61.0 in | Wt 105.0 lb

## 2019-01-27 DIAGNOSIS — Z0000 Encounter for general adult medical examination without abnormal findings: Secondary | ICD-10-CM | POA: Diagnosis not present

## 2019-01-27 DIAGNOSIS — H8102 Meniere's disease, left ear: Secondary | ICD-10-CM | POA: Diagnosis not present

## 2019-01-27 DIAGNOSIS — E782 Mixed hyperlipidemia: Secondary | ICD-10-CM | POA: Diagnosis not present

## 2019-01-27 DIAGNOSIS — G2581 Restless legs syndrome: Secondary | ICD-10-CM | POA: Diagnosis not present

## 2019-01-27 LAB — LIPID PANEL
Cholesterol: 190 mg/dL (ref 0–200)
HDL: 80.8 mg/dL (ref >39.00)
LDL Cholesterol: 96 mg/dL (ref 0–99)
NONHDL: 109.67
Total CHOL/HDL Ratio: 2
Triglycerides: 67 mg/dL (ref 0.0–149.0)
VLDL: 13.4 mg/dL (ref 0.0–40.0)

## 2019-01-27 LAB — COMPREHENSIVE METABOLIC PANEL
ALT: 27 U/L (ref 0–35)
AST: 34 U/L (ref 0–37)
Albumin: 4.1 g/dL (ref 3.5–5.2)
Alkaline Phosphatase: 83 U/L (ref 39–117)
BUN: 13 mg/dL (ref 6–23)
CO2: 28 meq/L (ref 19–32)
Calcium: 9 mg/dL (ref 8.4–10.5)
Chloride: 104 mEq/L (ref 96–112)
Creatinine, Ser: 0.75 mg/dL (ref 0.40–1.20)
GFR: 76.23 mL/min (ref >60.00)
Glucose, Bld: 80 mg/dL (ref 70–99)
Potassium: 4.1 mEq/L (ref 3.5–5.1)
Sodium: 141 mEq/L (ref 135–145)
Total Bilirubin: 1.1 mg/dL (ref 0.2–1.2)
Total Protein: 6.4 g/dL (ref 6.0–8.3)

## 2019-01-27 LAB — CBC
HCT: 41.9 % (ref 36.0–46.0)
Hemoglobin: 14.5 g/dL (ref 12.0–15.0)
MCHC: 34.5 g/dL (ref 30.0–36.0)
MCV: 91.6 fl (ref 78.0–100.0)
Platelets: 239 10*3/uL (ref 150.0–400.0)
RBC: 4.58 Mil/uL (ref 3.87–5.11)
RDW: 12.8 % (ref 11.5–15.5)
WBC: 4.8 10*3/uL (ref 4.0–10.5)

## 2019-01-27 LAB — FERRITIN: Ferritin: 76.1 ng/mL (ref 10.0–291.0)

## 2019-01-27 NOTE — Assessment & Plan Note (Signed)
Flu shot up to date. Pneumonia complete. Shingrix complete. Tetanus up to date. Colonoscopy up to date. Mammogram up to date, pap smear aged out and dexa up to date with gyn. Counseled about sun safety and mole surveillance. Counseled about the dangers of distracted driving. Given 10 year screening recommendations.

## 2019-01-27 NOTE — Assessment & Plan Note (Signed)
Doing well on requip 0.25 mg nightly and does not need dose increase.

## 2019-01-27 NOTE — Assessment & Plan Note (Signed)
Checking lipid panel and adjust crestor 5 mg daily as needed. 

## 2019-01-27 NOTE — Assessment & Plan Note (Signed)
Stable with hearing changes which are stable.

## 2019-01-27 NOTE — Progress Notes (Signed)
   Subjective:   Patient ID: Jasmine Pham, female    DOB: [birth year], 73 y.o.   MRN: [id number]  HPI Here for medicare wellness and CPE, no new complaints. Please see A/P for status and treatment of chronic medical problems.   Diet: heart healthy Physical activity: sedentary Depression/mood screen: negative Hearing: intact to whispered voice Visual acuity: grossly normal, performs annual eye exam  ADLs: capable Fall risk: none Home safety: good Cognitive evaluation: intact to orientation, naming, recall and repetition EOL planning: adv directives discussed    Office Visit from 01/27/2019 in [facility]  PHQ-2 Total Score  0      I have personally reviewed and have noted 1. The patient's medical and social history - reviewed today no changes 2. Their use of alcohol, tobacco or illicit drugs 3. Their current medications and supplements 4. The patient's functional ability including ADL's, fall risks, home safety risks and hearing or visual impairment. 5. Diet and physical activities 6. Evidence for depression or mood disorders 7. Care team reviewed and updated (available in snapshot)  Review of Systems  Constitutional: Negative.   HENT: Negative.   Eyes: Negative.   Respiratory: Negative for cough, chest tightness and shortness of breath.   Cardiovascular: Negative for chest pain, palpitations and leg swelling.  Gastrointestinal: Negative for abdominal distention, abdominal pain, constipation, diarrhea, nausea and vomiting.  Musculoskeletal: Negative.   Skin: Negative.   Neurological: Negative.   Psychiatric/Behavioral: Negative.     Objective:  Physical Exam Constitutional:      Appearance: She is well-developed.  HENT:     Head: Normocephalic and atraumatic.  Neck:     Musculoskeletal: Normal range of motion.  Cardiovascular:     Rate and Rhythm: Normal rate and regular rhythm.  Pulmonary:     Effort: Pulmonary effort is normal. No  respiratory distress.     Breath sounds: Normal breath sounds. No wheezing or rales.  Abdominal:     General: Bowel sounds are normal. There is no distension.     Palpations: Abdomen is soft.     Tenderness: There is no abdominal tenderness. There is no rebound.  Skin:    General: Skin is warm and dry.  Neurological:     Mental Status: She is alert and oriented to person, place, and time.     Coordination: Coordination normal.     Vitals:   01/27/19 1034  BP: 124/80  Pulse: 62  Temp: 97.8 F (36.6 C)  TempSrc: Oral  SpO2: 98%  Weight: 105 lb (47.6 kg)  Height: 5\' 1"  (1.549 m)    Assessment & Plan:

## 2019-01-27 NOTE — Patient Instructions (Signed)

## 2019-02-17 ENCOUNTER — Other Ambulatory Visit: Payer: Self-pay | Admitting: Internal Medicine

## 2019-03-21 ENCOUNTER — Other Ambulatory Visit: Payer: Self-pay | Admitting: Internal Medicine

## 2019-06-15 ENCOUNTER — Telehealth: Payer: Self-pay | Admitting: Emergency Medicine

## 2019-06-15 NOTE — Telephone Encounter (Signed)
appt schedule

## 2019-06-15 NOTE — Telephone Encounter (Signed)
Copied from [place] [id number]. Topic: Appointment Scheduling - Scheduling Inquiry for Clinic >> Jun 15, 2019  4:04 PM Rutherford Nail, [place] wrote: Reason for CRM: Patient calling and is requesting an appointment due to soreness under her rib cage on the right side. Please advise. CB#: [phone] >> Jun 15, 2019  4:24 PM Terence Lux B, CMA wrote: LVM for pt to call back and schedule.

## 2019-06-18 ENCOUNTER — Encounter: Payer: Self-pay | Admitting: Internal Medicine

## 2019-06-18 ENCOUNTER — Ambulatory Visit: Payer: Medicare Other | Admitting: Internal Medicine

## 2019-12-08 ENCOUNTER — Other Ambulatory Visit: Payer: Self-pay | Admitting: Internal Medicine

## 2020-01-10 ENCOUNTER — Ambulatory Visit: Payer: Medicare Other

## 2020-01-15 ENCOUNTER — Ambulatory Visit: Payer: Medicare Other

## 2020-01-18 ENCOUNTER — Ambulatory Visit: Payer: Medicare Other

## 2020-01-29 ENCOUNTER — Encounter: Payer: Self-pay | Admitting: Internal Medicine

## 2020-01-29 MED ORDER — ROPINIROLE HCL 0.25 MG PO TABS: 0.2500 mg | ORAL_TABLET | Freq: Three times a day (TID) | ORAL | 0 refills | Status: DC

## 2020-02-09 ENCOUNTER — Other Ambulatory Visit: Payer: Self-pay | Admitting: Obstetrics & Gynecology

## 2020-02-09 DIAGNOSIS — Z1231 Encounter for screening mammogram for malignant neoplasm of breast: Secondary | ICD-10-CM

## 2020-02-25 ENCOUNTER — Other Ambulatory Visit: Payer: Self-pay | Admitting: Internal Medicine

## 2020-03-09 ENCOUNTER — Other Ambulatory Visit: Payer: Self-pay | Admitting: Internal Medicine

## 2020-03-15 DIAGNOSIS — Z6820 Body mass index (BMI) 20.0-20.9, adult: Secondary | ICD-10-CM | POA: Diagnosis not present

## 2020-03-15 DIAGNOSIS — Z01419 Encounter for gynecological examination (general) (routine) without abnormal findings: Secondary | ICD-10-CM | POA: Diagnosis not present

## 2020-03-16 NOTE — Telephone Encounter (Signed)
No was given 30 day only as more than 1 year since visit and needs visit for any more refills than the prior 30 day script

## 2020-03-22 ENCOUNTER — Encounter: Payer: Self-pay | Admitting: Internal Medicine

## 2020-03-28 ENCOUNTER — Ambulatory Visit
Admission: RE | Admit: 2020-03-28 | Discharge: 2020-03-28 | Disposition: A | Discharge status: Home / Self Care | Payer: Medicare PPO | Source: Ambulatory Visit | Attending: Obstetrics & Gynecology | Admitting: Obstetrics & Gynecology

## 2020-03-28 ENCOUNTER — Other Ambulatory Visit: Payer: Self-pay

## 2020-03-28 DIAGNOSIS — Z1231 Encounter for screening mammogram for malignant neoplasm of breast: Secondary | ICD-10-CM | POA: Diagnosis not present

## 2020-03-30 ENCOUNTER — Ambulatory Visit (INDEPENDENT_AMBULATORY_CARE_PROVIDER_SITE_OTHER): Payer: Medicare PPO | Admitting: Internal Medicine

## 2020-03-30 ENCOUNTER — Other Ambulatory Visit: Payer: Self-pay

## 2020-03-30 ENCOUNTER — Encounter: Payer: Self-pay | Admitting: Internal Medicine

## 2020-03-30 VITALS — BP 154/74 | HR 58 | Temp 98.0°F | Wt 105.6 lb

## 2020-03-30 DIAGNOSIS — Z0000 Encounter for general adult medical examination without abnormal findings: Secondary | ICD-10-CM

## 2020-03-30 DIAGNOSIS — E782 Mixed hyperlipidemia: Secondary | ICD-10-CM | POA: Diagnosis not present

## 2020-03-30 DIAGNOSIS — H8102 Meniere's disease, left ear: Secondary | ICD-10-CM | POA: Diagnosis not present

## 2020-03-30 DIAGNOSIS — G2581 Restless legs syndrome: Secondary | ICD-10-CM

## 2020-03-30 LAB — COMPREHENSIVE METABOLIC PANEL
ALT: 18 U/L (ref 0–35)
AST: 31 U/L (ref 0–37)
Albumin: 4 g/dL (ref 3.5–5.2)
Alkaline Phosphatase: 82 U/L (ref 39–117)
BUN: 12 mg/dL (ref 6–23)
CO2: 31 mEq/L (ref 19–32)
Calcium: 8.8 mg/dL (ref 8.4–10.5)
Chloride: 103 mEq/L (ref 96–112)
Creatinine, Ser: 0.63 mg/dL (ref 0.40–1.20)
GFR: 92.91 mL/min (ref >60.00)
Glucose, Bld: 80 mg/dL (ref 70–99)
Potassium: 4 mEq/L (ref 3.5–5.1)
Sodium: 139 mEq/L (ref 135–145)
Total Bilirubin: 1.2 mg/dL (ref 0.2–1.2)
Total Protein: 6.2 g/dL (ref 6.0–8.3)

## 2020-03-30 LAB — LIPID PANEL
Cholesterol: 201 mg/dL — ABNORMAL HIGH (ref 0–200)
HDL: 88.1 mg/dL (ref >39.00)
LDL Cholesterol: 100 mg/dL — ABNORMAL HIGH (ref 0–99)
NonHDL: 113.09
Total CHOL/HDL Ratio: 2
Triglycerides: 65 mg/dL (ref 0.0–149.0)
VLDL: 13 mg/dL (ref 0.0–40.0)

## 2020-03-30 MED ORDER — ROPINIROLE HCL 0.25 MG PO TABS: 0.2500 mg | ORAL_TABLET | Freq: Three times a day (TID) | ORAL | 11 refills | Status: DC

## 2020-03-30 NOTE — Assessment & Plan Note (Signed)
Refill requip which is overall working well. No progression. Checking CBC to ensure no change.

## 2020-03-30 NOTE — Assessment & Plan Note (Signed)
Taking crestor 5 mg daily, checking lipid panel and adjust as needed.  

## 2020-03-30 NOTE — Progress Notes (Signed)
Subjective:   Patient ID: Jasmine Pham, female    DOB: Jun 04, 1946, 74 y.o.   MRN: [id number]  HPI Here for medicare wellness and physical, no new complaints. Please see A/P for status and treatment of chronic medical problems.   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 The patient's medical and social history - reviewed today no changes 2. Their use of alcohol, tobacco or illicit drugs 3. Their current medications and supplements 4. The patient's functional ability including ADL's, fall risks, home safety risks and hearing or visual impairment. 5. Diet and physical activities 6. Evidence for depression or mood disorders 7. Care team reviewed and updated  Patient Care Team: Myrlene Broker, MD as PCP - General (Internal Medicine) Freddy Finner, MD (Obstetrics and Gynecology) Past Medical History:  Diagnosis Date  . ALLERGIC RHINITIS   . Anxiety   . COLONIC POLYPS, HX OF   . HYPERLIPIDEMIA   . UNSPECIFIED MENIERES DISEASE    Past Surgical History:  Procedure Laterality Date  . CATARACT EXTRACTION Bilateral 2019  . TONSILLECTOMY  1956   Family History  Problem Relation Age of Onset  . Prostate cancer Father   . Colon cancer Other   . Hyperlipidemia Other   . Kidney disease Other    Review of Systems  Constitutional: Negative.   HENT: Negative.   Eyes: Negative.   Respiratory: Negative for cough, chest tightness and shortness of breath.   Cardiovascular: Negative for chest pain, palpitations and leg swelling.   Gastrointestinal: Negative for abdominal distention, abdominal pain, constipation, diarrhea, nausea and vomiting.  Musculoskeletal: Negative.   Skin: Negative.   Neurological: Negative.   Psychiatric/Behavioral: Negative.     Objective:  Physical Exam Constitutional:      Appearance: She is well-developed.  HENT:     Head: Normocephalic and atraumatic.  Cardiovascular:     Rate and Rhythm: Normal rate and regular rhythm.  Pulmonary:     Effort: Pulmonary effort is normal. No respiratory distress.     Breath sounds: Normal breath sounds. No wheezing or rales.  Abdominal:     General: Bowel sounds are normal. There is no distension.     Palpations: Abdomen is soft.     Tenderness: There is no abdominal tenderness. There is no rebound.  Musculoskeletal:     Cervical back: Normal range of motion.  Skin:    General: Skin is warm and dry.  Neurological:     Mental Status: She is alert and oriented to person, place, and time.     Coordination: Coordination normal.     Vitals:   03/30/20 1008  BP: (!) 154/74  Pulse: (!) 58  Temp: 98 F (36.7 C)  SpO2: 99%  Weight: 105 lb 9.6 oz (47.9 kg)    This visit occurred during the SARS-CoV-2 public health emergency.  Safety protocols were in place, including screening questions prior to the visit, additional usage of staff PPE, and extensive cleaning of exam room while observing appropriate contact time as indicated for disinfecting solutions.   Assessment & Plan:

## 2020-03-30 NOTE — Assessment & Plan Note (Signed)
Stable using chlorthalidone prn. Hearing stable.

## 2020-03-30 NOTE — Patient Instructions (Signed)
Health Maintenance, Female Adopting a healthy lifestyle and getting preventive care are important in promoting health and wellness. Ask your health care provider about:  The right schedule for you to have regular tests and exams.  Things you can do on your own to prevent diseases and keep yourself healthy. What should I know about diet, weight, and exercise? Eat a healthy diet   Eat a diet that includes plenty of vegetables, fruits, low-fat dairy products, and lean protein.  Do not eat a lot of foods that are high in solid fats, added sugars, or sodium. Maintain a healthy weight Body mass index (BMI) is used to identify weight problems. It estimates body fat based on height and weight. Your health care provider can help determine your BMI and help you achieve or maintain a healthy weight. Get regular exercise Get regular exercise. This is one of the most important things you can do for your health. Most adults should:  Exercise for at least 150 minutes each week. The exercise should increase your heart rate and make you sweat (moderate-intensity exercise).  Do strengthening exercises at least twice a week. This is in addition to the moderate-intensity exercise.  Spend less time sitting. Even light physical activity can be beneficial. Watch cholesterol and blood lipids Have your blood tested for lipids and cholesterol at 72 years of age, then have this test every 5 years. Have your cholesterol levels checked more often if:  Your lipid or cholesterol levels are high.  You are older than 72 years of age.  You are at high risk for heart disease. What should I know about cancer screening? Depending on your health history and family history, you may need to have cancer screening at various ages. This may include screening for:  Breast cancer.  Cervical cancer.  Colorectal cancer.  Skin cancer.  Lung cancer. What should I know about heart disease, diabetes, and high blood  pressure? Blood pressure and heart disease  High blood pressure causes heart disease and increases the risk of stroke. This is more likely to develop in people who have high blood pressure readings, are of African descent, or are overweight.  Have your blood pressure checked: ? Every 3-5 years if you are 18-39 years of age. ? Every year if you are 40 years old or older. Diabetes Have regular diabetes screenings. This checks your fasting blood sugar level. Have the screening done:  Once every three years after age 40 if you are at a normal weight and have a low risk for diabetes.  More often and at a younger age if you are overweight or have a high risk for diabetes. What should I know about preventing infection? Hepatitis B If you have a higher risk for hepatitis B, you should be screened for this virus. Talk with your health care provider to find out if you are at risk for hepatitis B infection. Hepatitis C Testing is recommended for:  Everyone born from 1945 through 1965.  Anyone with known risk factors for hepatitis C. Sexually transmitted infections (STIs)  Get screened for STIs, including gonorrhea and chlamydia, if: ? You are sexually active and are younger than 72 years of age. ? You are older than 72 years of age and your health care provider tells you that you are at risk for this type of infection. ? Your sexual activity has changed since you were last screened, and you are at increased risk for chlamydia or gonorrhea. Ask your health care provider if   you are at risk.  Ask your health care provider about whether you are at high risk for HIV. Your health care provider may recommend a prescription medicine to help prevent HIV infection. If you choose to take medicine to prevent HIV, you should first get tested for HIV. You should then be tested every 3 months for as long as you are taking the medicine. Pregnancy  If you are about to stop having your period (premenopausal) and  you may become pregnant, seek counseling before you get pregnant.  Take 400 to 800 micrograms (mcg) of folic acid every day if you become pregnant.  Ask for birth control (contraception) if you want to prevent pregnancy. Osteoporosis and menopause Osteoporosis is a disease in which the bones lose minerals and strength with aging. This can result in bone fractures. If you are 65 years old or older, or if you are at risk for osteoporosis and fractures, ask your health care provider if you should:  Be screened for bone loss.  Take a calcium or vitamin D supplement to lower your risk of fractures.  Be given hormone replacement therapy (HRT) to treat symptoms of menopause. Follow these instructions at home: Lifestyle  Do not use any products that contain nicotine or tobacco, such as cigarettes, e-cigarettes, and chewing tobacco. If you need help quitting, ask your health care provider.  Do not use street drugs.  Do not share needles.  Ask your health care provider for help if you need support or information about quitting drugs. Alcohol use  Do not drink alcohol if: ? Your health care provider tells you not to drink. ? You are pregnant, may be pregnant, or are planning to become pregnant.  If you drink alcohol: ? Limit how much you use to 0-1 drink a day. ? Limit intake if you are breastfeeding.  Be aware of how much alcohol is in your drink. In the [place] one drink equals one 12 oz bottle of beer (355 mL), one 5 oz glass of wine (148 mL), or one 1 oz glass of hard liquor (44 mL). General instructions  Schedule regular health, dental, and eye exams.  Stay current with your vaccines.  Tell your health care provider if: ? You often feel depressed. ? You have ever been abused or do not feel safe at home. Summary  Adopting a healthy lifestyle and getting preventive care are important in promoting health and wellness.  Follow your health care provider's instructions about healthy  diet, exercising, and getting tested or screened for diseases.  Follow your health care provider's instructions on monitoring your cholesterol and blood pressure. This information is not intended to replace advice given to you by your health care provider. Make sure you discuss any questions you have with your health care provider. Document Revised: 11/19/2018 Document Reviewed: 11/19/2018 Elsevier Patient Education  2020 [employer]  

## 2020-03-30 NOTE — Assessment & Plan Note (Signed)
Flu shot up to date. Covid-19 complete. Pneumonia complete. Shingrix complete. Tetanus due 2027. Colonoscopy due 2024. Mammogram due 2023, pap smear aged out and dexa no further. Counseled about sun safety and mole surveillance. Counseled about the dangers of distracted driving. Given 10 year screening recommendations.

## 2020-03-31 LAB — CBC
HCT: 39.7 % (ref 36.0–46.0)
Hemoglobin: 13.5 g/dL (ref 12.0–15.0)
MCHC: 34 g/dL (ref 30.0–36.0)
MCV: 93.2 fl (ref 78.0–100.0)
Platelets: 231 10*3/uL (ref 150.0–400.0)
RBC: 4.26 Mil/uL (ref 3.87–5.11)
RDW: 13.3 % (ref 11.5–15.5)
WBC: 4.2 10*3/uL (ref 4.0–10.5)

## 2020-04-17 ENCOUNTER — Other Ambulatory Visit: Payer: Self-pay | Admitting: Internal Medicine

## 2020-11-02 ENCOUNTER — Other Ambulatory Visit (HOSPITAL_COMMUNITY): Payer: Self-pay | Admitting: Internal Medicine

## 2021-02-01 IMAGING — MG DIGITAL SCREENING BILAT W/ TOMO W/ CAD
8 series · 9 of 24 positions shown · non-contrast
Comparison: Previous exam(s).

CLINICAL DATA: Screening.

EXAM:
DIGITAL SCREENING BILATERAL MAMMOGRAM WITH TOMO AND CAD

[R CC synth-2D]
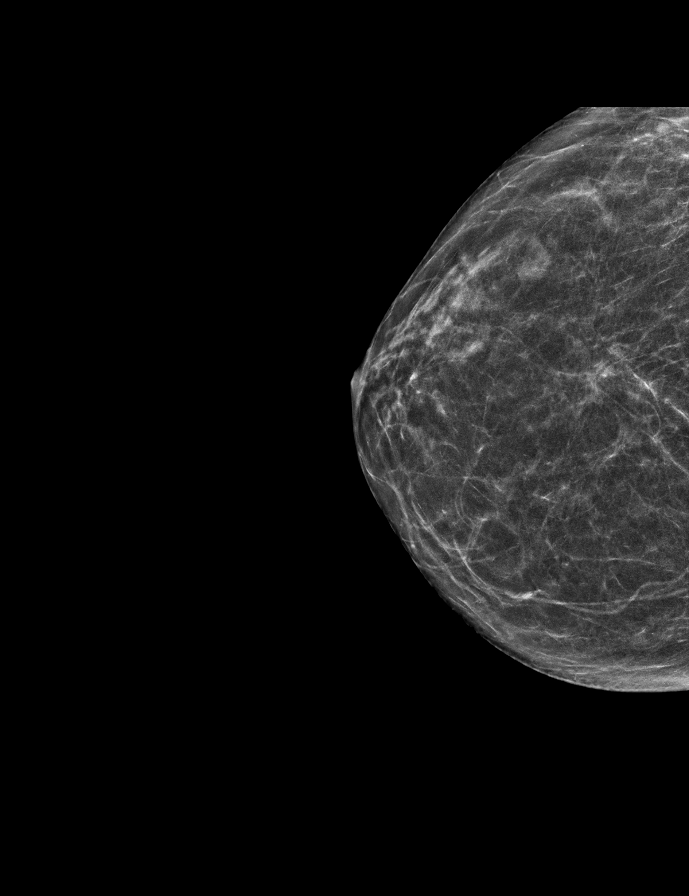

[R MLO synth-2D]
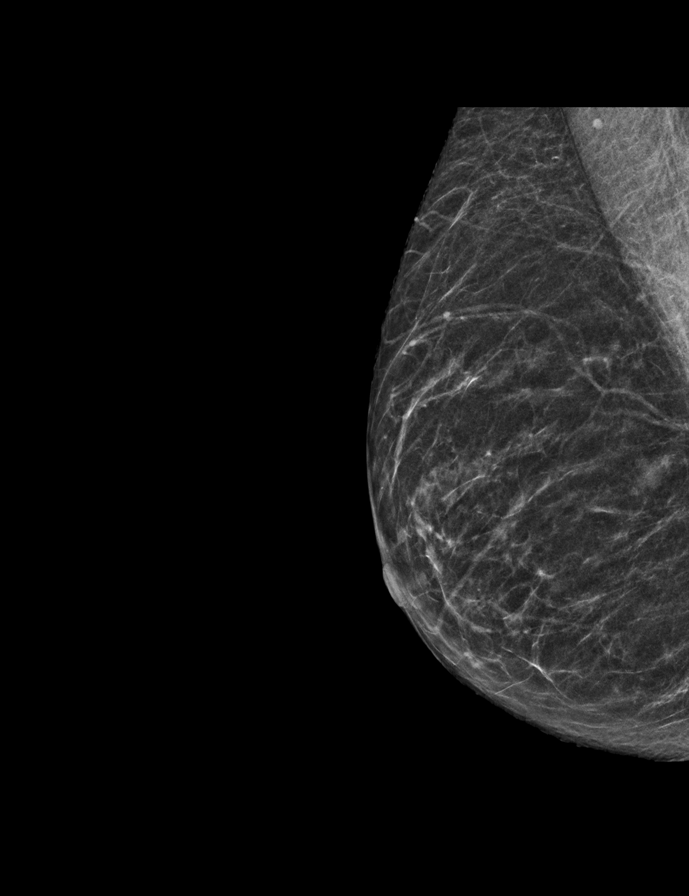

[L CC synth-2D]
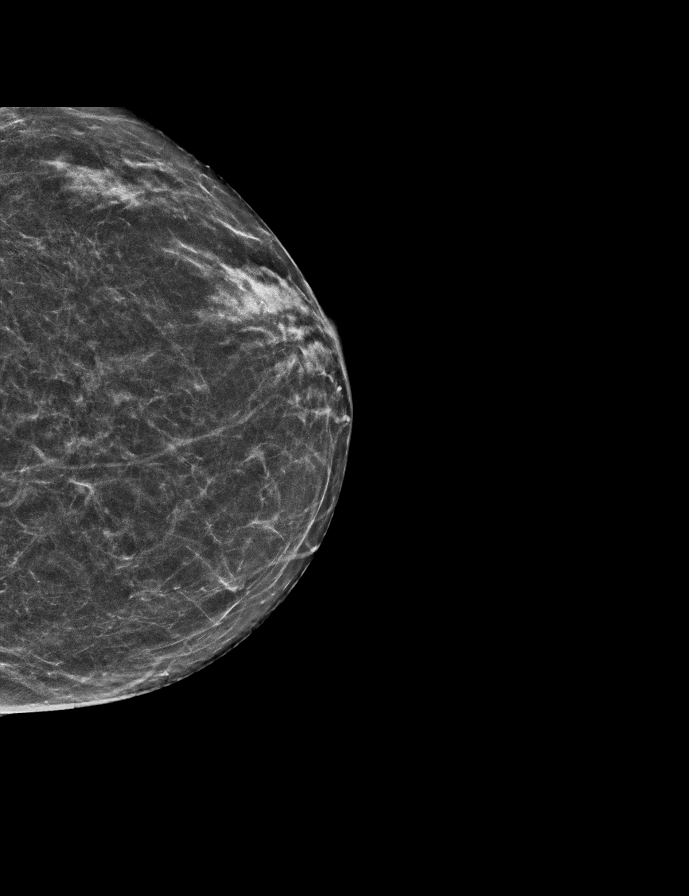

[L MLO synth-2D]
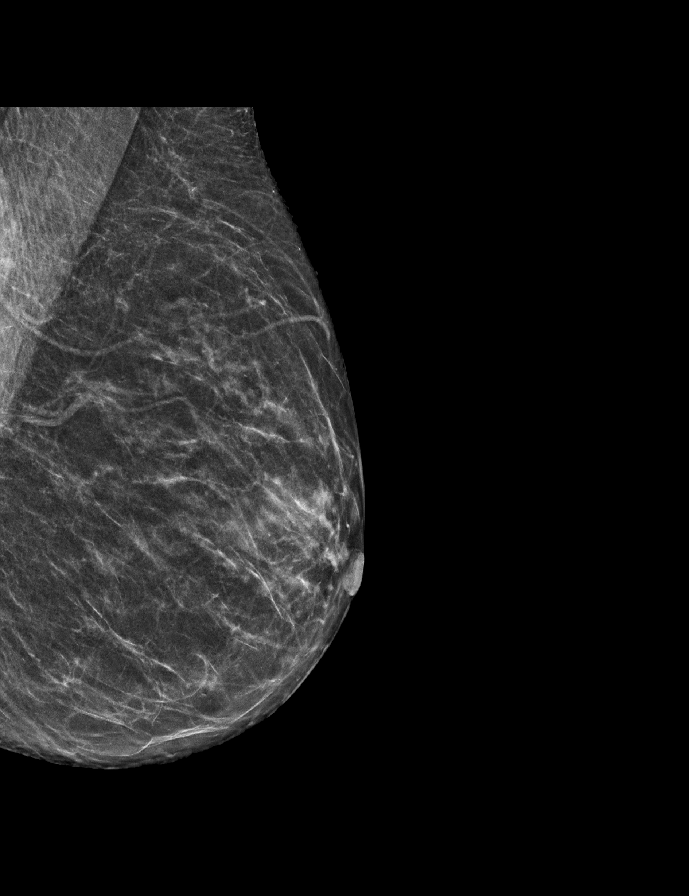

[L CC tomo · 2 of 49 frames shown]
[frame 16/49]
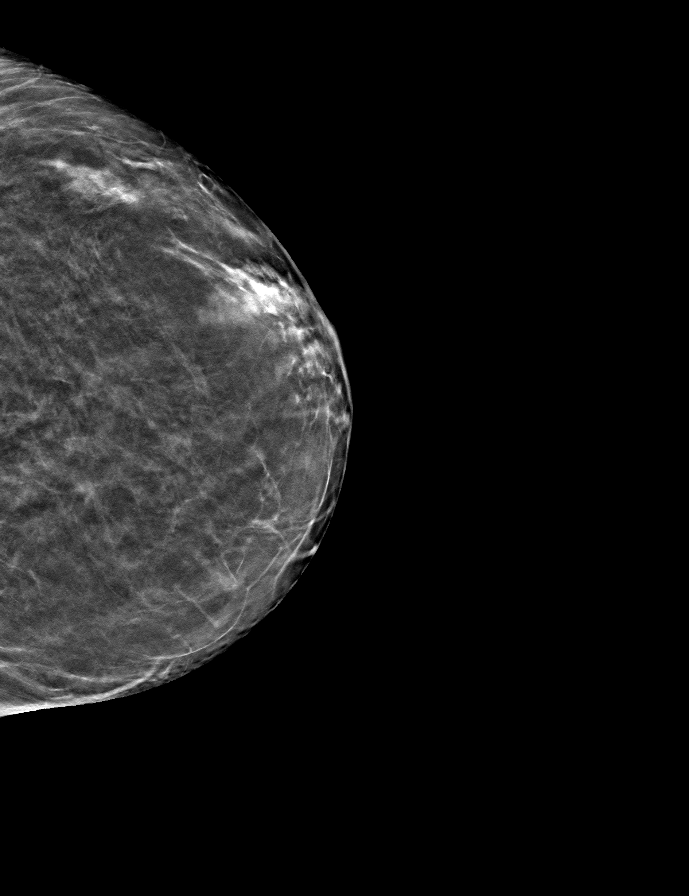
[frame 25/49]
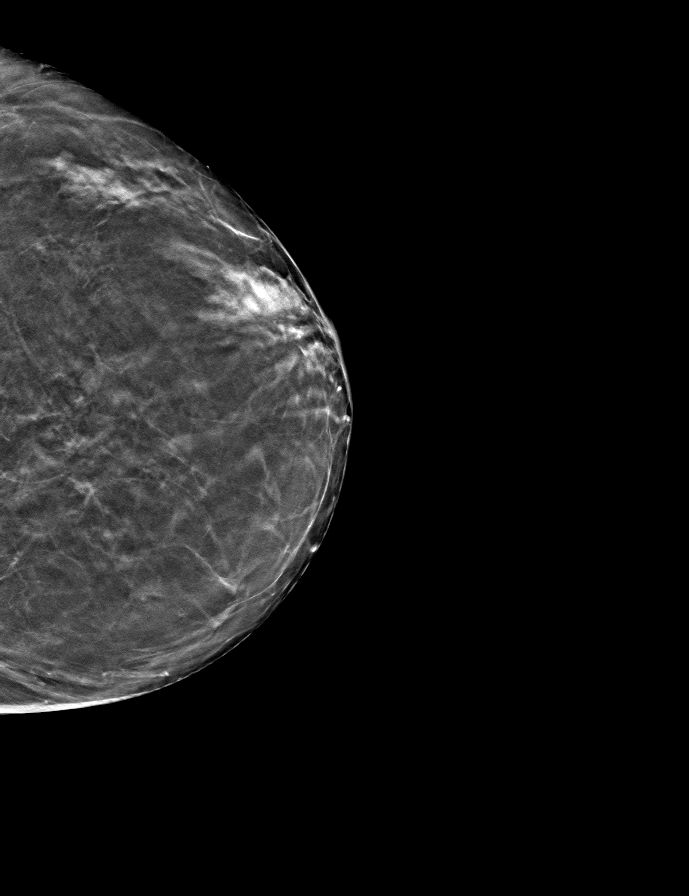

[R CC tomo · tomo slice 25/49.0]
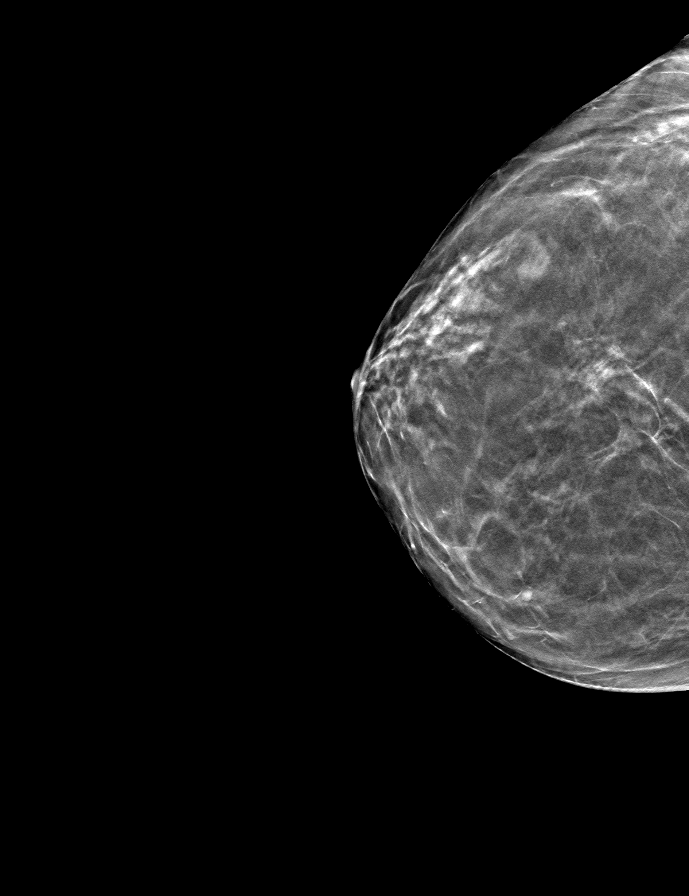

[L MLO tomo · tomo slice 27/52.0]
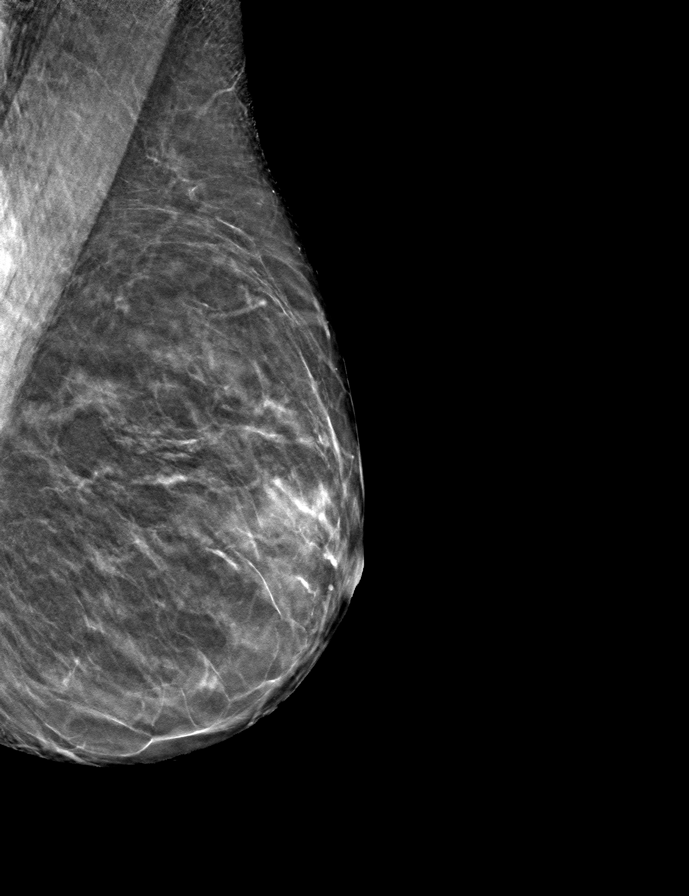

[R MLO tomo · tomo slice 24/47.0]
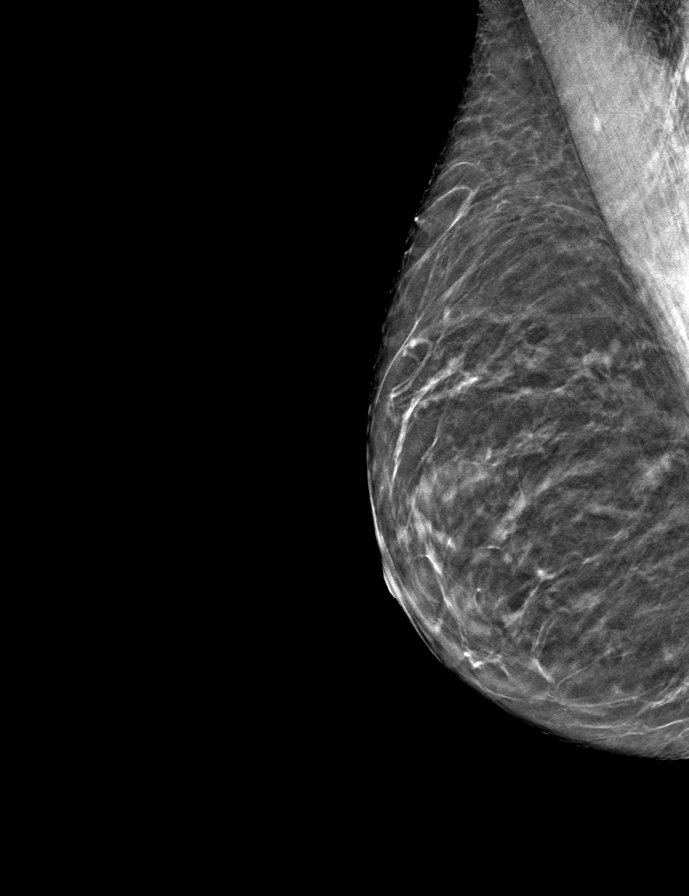

[9 of 24 positions shown; findings below may reference images not displayed]

ACR Breast Density Category b: There are scattered areas of
fibroglandular density.
FINDINGS: There are no findings suspicious for malignancy. Images were
processed with CAD.
IMPRESSION: No mammographic evidence of malignancy. A result letter of this
screening mammogram will be mailed directly to the patient.

RECOMMENDATION:
Screening mammogram in one year. (Code:[id number])

BI-RADS CATEGORY  1: Negative.

## 2021-02-13 DIAGNOSIS — H02831 Dermatochalasis of right upper eyelid: Secondary | ICD-10-CM | POA: Diagnosis not present

## 2021-02-13 DIAGNOSIS — Z961 Presence of intraocular lens: Secondary | ICD-10-CM | POA: Diagnosis not present

## 2021-02-13 DIAGNOSIS — D3131 Benign neoplasm of right choroid: Secondary | ICD-10-CM | POA: Diagnosis not present

## 2021-02-13 DIAGNOSIS — H35372 Puckering of macula, left eye: Secondary | ICD-10-CM | POA: Diagnosis not present

## 2021-02-13 DIAGNOSIS — H02834 Dermatochalasis of left upper eyelid: Secondary | ICD-10-CM | POA: Diagnosis not present

## 2021-03-10 ENCOUNTER — Other Ambulatory Visit: Payer: Self-pay | Admitting: Obstetrics & Gynecology

## 2021-03-10 DIAGNOSIS — Z1231 Encounter for screening mammogram for malignant neoplasm of breast: Secondary | ICD-10-CM

## 2021-03-22 ENCOUNTER — Ambulatory Visit: Payer: Medicare PPO | Attending: Internal Medicine

## 2021-03-22 DIAGNOSIS — Z23 Encounter for immunization: Secondary | ICD-10-CM

## 2021-03-22 NOTE — Progress Notes (Signed)
   Covid-19 Vaccination Clinic  Name:  Jasmine Pham    MRN: [id number] DOB: 1946-08-24  03/22/2021  [name] was observed post Covid-19 immunization for 15 minutes without incident. She was provided with Vaccine Information Sheet and instruction to access the V-Safe system.   [name] was instructed to call 911 with any severe reactions post vaccine: Marland Kitchen Difficulty breathing  . Swelling of face and throat  . A fast heartbeat  . A bad rash all over body  . Dizziness and weakness   Immunizations Administered    Name Date Dose VIS Date Route   Moderna Covid-19 Booster Vaccine 03/22/2021 12:02 PM 0.25 mL 09/28/2020 Intramuscular   Manufacturer: Moderna   Lot: [id number]   NDC: 83818-403-75

## 2021-03-27 DIAGNOSIS — H53483 Generalized contraction of visual field, bilateral: Secondary | ICD-10-CM | POA: Diagnosis not present

## 2021-03-27 DIAGNOSIS — H02413 Mechanical ptosis of bilateral eyelids: Secondary | ICD-10-CM | POA: Diagnosis not present

## 2021-03-27 DIAGNOSIS — H02834 Dermatochalasis of left upper eyelid: Secondary | ICD-10-CM | POA: Diagnosis not present

## 2021-03-27 DIAGNOSIS — H57813 Brow ptosis, bilateral: Secondary | ICD-10-CM | POA: Diagnosis not present

## 2021-03-27 DIAGNOSIS — H02831 Dermatochalasis of right upper eyelid: Secondary | ICD-10-CM | POA: Diagnosis not present

## 2021-03-27 DIAGNOSIS — H02423 Myogenic ptosis of bilateral eyelids: Secondary | ICD-10-CM | POA: Diagnosis not present

## 2021-03-27 DIAGNOSIS — H0279 Other degenerative disorders of eyelid and periocular area: Secondary | ICD-10-CM | POA: Diagnosis not present

## 2021-03-28 ENCOUNTER — Other Ambulatory Visit (HOSPITAL_COMMUNITY): Payer: Self-pay

## 2021-03-28 MED ORDER — MODERNA COVID-19 VACCINE 100 MCG/0.5ML IM SUSP
INTRAMUSCULAR | 0 refills | Status: DC
  Filled 2021-03-28: qty 0.5, 17d supply, fill #0

## 2021-03-29 ENCOUNTER — Other Ambulatory Visit (HOSPITAL_COMMUNITY): Payer: Self-pay

## 2021-04-12 DIAGNOSIS — H53483 Generalized contraction of visual field, bilateral: Secondary | ICD-10-CM | POA: Diagnosis not present

## 2021-04-14 ENCOUNTER — Other Ambulatory Visit: Payer: Self-pay

## 2021-04-17 ENCOUNTER — Ambulatory Visit (INDEPENDENT_AMBULATORY_CARE_PROVIDER_SITE_OTHER): Payer: Medicare PPO | Admitting: Internal Medicine

## 2021-04-17 ENCOUNTER — Other Ambulatory Visit: Payer: Self-pay

## 2021-04-17 ENCOUNTER — Encounter: Payer: Self-pay | Admitting: Internal Medicine

## 2021-04-17 VITALS — BP 140/70 | HR 54 | Temp 98.2°F | Ht 61.0 in | Wt 99.8 lb

## 2021-04-17 DIAGNOSIS — H8102 Meniere's disease, left ear: Secondary | ICD-10-CM

## 2021-04-17 DIAGNOSIS — E782 Mixed hyperlipidemia: Secondary | ICD-10-CM | POA: Diagnosis not present

## 2021-04-17 DIAGNOSIS — G2581 Restless legs syndrome: Secondary | ICD-10-CM | POA: Diagnosis not present

## 2021-04-17 DIAGNOSIS — Z0000 Encounter for general adult medical examination without abnormal findings: Secondary | ICD-10-CM | POA: Diagnosis not present

## 2021-04-17 LAB — COMPREHENSIVE METABOLIC PANEL
ALT: 15 U/L (ref 0–35)
AST: 22 U/L (ref 0–37)
Albumin: 4.3 g/dL (ref 3.5–5.2)
Alkaline Phosphatase: 81 U/L (ref 39–117)
BUN: 14 mg/dL (ref 6–23)
CO2: 35 mEq/L — ABNORMAL HIGH (ref 19–32)
Calcium: 9.6 mg/dL (ref 8.4–10.5)
Chloride: 96 mEq/L (ref 96–112)
Creatinine, Ser: 0.69 mg/dL (ref 0.40–1.20)
GFR: 86.36 mL/min (ref >60.00)
Glucose, Bld: 87 mg/dL (ref 70–99)
Potassium: 3.7 mEq/L (ref 3.5–5.1)
Sodium: 139 mEq/L (ref 135–145)
Total Bilirubin: 1.1 mg/dL (ref 0.2–1.2)
Total Protein: 6.9 g/dL (ref 6.0–8.3)

## 2021-04-17 LAB — CBC
HCT: 41.6 % (ref 36.0–46.0)
Hemoglobin: 14.8 g/dL (ref 12.0–15.0)
MCHC: 35.5 g/dL (ref 30.0–36.0)
MCV: 89.4 fl (ref 78.0–100.0)
Platelets: 239 10*3/uL (ref 150.0–400.0)
RBC: 4.66 Mil/uL (ref 3.87–5.11)
RDW: 12.8 % (ref 11.5–15.5)
WBC: 4.9 10*3/uL (ref 4.0–10.5)

## 2021-04-17 LAB — LIPID PANEL
Cholesterol: 212 mg/dL — ABNORMAL HIGH (ref 0–200)
HDL: 83.7 mg/dL (ref >39.00)
LDL Cholesterol: 106 mg/dL — ABNORMAL HIGH (ref 0–99)
NonHDL: 128.38
Total CHOL/HDL Ratio: 3
Triglycerides: 113 mg/dL (ref 0.0–149.0)
VLDL: 22.6 mg/dL (ref 0.0–40.0)

## 2021-04-17 LAB — FERRITIN: Ferritin: 110.3 ng/mL (ref 10.0–291.0)

## 2021-04-17 NOTE — Patient Instructions (Addendum)
You can take 3 of the requip a little earlier to see if this helps better.    Health Maintenance, Female Adopting a healthy lifestyle and getting preventive care are important in promoting health and wellness. Ask your health care provider about:  The right schedule for you to have regular tests and exams.  Things you can do on your own to prevent diseases and keep yourself healthy. What should I know about diet, weight, and exercise? Eat a healthy diet  Eat a diet that includes plenty of vegetables, fruits, low-fat dairy products, and lean protein.  Do not eat a lot of foods that are high in solid fats, added sugars, or sodium.   Maintain a healthy weight Body mass index (BMI) is used to identify weight problems. It estimates body fat based on height and weight. Your health care provider can help determine your BMI and help you achieve or maintain a healthy weight. Get regular exercise Get regular exercise. This is one of the most important things you can do for your health. Most adults should:  Exercise for at least 150 minutes each week. The exercise should increase your heart rate and make you sweat (moderate-intensity exercise).  Do strengthening exercises at least twice a week. This is in addition to the moderate-intensity exercise.  Spend less time sitting. Even light physical activity can be beneficial. Watch cholesterol and blood lipids Have your blood tested for lipids and cholesterol at 73 years of age, then have this test every 5 years. Have your cholesterol levels checked more often if:  Your lipid or cholesterol levels are high.  You are older than 73 years of age.  You are at high risk for heart disease. What should I know about cancer screening? Depending on your health history and family history, you may need to have cancer screening at various ages. This may include screening for:  Breast cancer.  Cervical cancer.  Colorectal cancer.  Skin cancer.  Lung  cancer. What should I know about heart disease, diabetes, and high blood pressure? Blood pressure and heart disease  High blood pressure causes heart disease and increases the risk of stroke. This is more likely to develop in people who have high blood pressure readings, are of African descent, or are overweight.  Have your blood pressure checked: ? Every 3-5 years if you are 75-46 years of age. ? Every year if you are 65 years old or older. Diabetes Have regular diabetes screenings. This checks your fasting blood sugar level. Have the screening done:  Once every three years after age 34 if you are at a normal weight and have a low risk for diabetes.  More often and at a younger age if you are overweight or have a high risk for diabetes. What should I know about preventing infection? Hepatitis B If you have a higher risk for hepatitis B, you should be screened for this virus. Talk with your health care provider to find out if you are at risk for hepatitis B infection. Hepatitis C Testing is recommended for:  Everyone born from [birth year] through 1965.  Anyone with known risk factors for hepatitis C. Sexually transmitted infections (STIs)  Get screened for STIs, including gonorrhea and chlamydia, if: ? You are sexually active and are younger than 73 years of age. ? You are older than 73 years of age and your health care provider tells you that you are at risk for this type of infection. ? Your sexual activity has changed since  you were last screened, and you are at increased risk for chlamydia or gonorrhea. Ask your health care provider if you are at risk.  Ask your health care provider about whether you are at high risk for HIV. Your health care provider may recommend a prescription medicine to help prevent HIV infection. If you choose to take medicine to prevent HIV, you should first get tested for HIV. You should then be tested every 3 months for as long as you are taking the  medicine. Pregnancy  If you are about to stop having your period (premenopausal) and you may become pregnant, seek counseling before you get pregnant.  Take 400 to 800 micrograms (mcg) of folic acid every day if you become pregnant.  Ask for birth control (contraception) if you want to prevent pregnancy. Osteoporosis and menopause Osteoporosis is a disease in which the bones lose minerals and strength with aging. This can result in bone fractures. If you are 64 years old or older, or if you are at risk for osteoporosis and fractures, ask your health care provider if you should:  Be screened for bone loss.  Take a calcium or vitamin D supplement to lower your risk of fractures.  Be given hormone replacement therapy (HRT) to treat symptoms of menopause. Follow these instructions at home: Lifestyle  Do not use any products that contain nicotine or tobacco, such as cigarettes, e-cigarettes, and chewing tobacco. If you need help quitting, ask your health care provider.  Do not use street drugs.  Do not share needles.  Ask your health care provider for help if you need support or information about quitting drugs. Alcohol use  Do not drink alcohol if: ? Your health care provider tells you not to drink. ? You are pregnant, may be pregnant, or are planning to become pregnant.  If you drink alcohol: ? Limit how much you use to 0-1 drink a day. ? Limit intake if you are breastfeeding.  Be aware of how much alcohol is in your drink. In the [place] one drink equals one 12 oz bottle of beer (355 mL), one 5 oz glass of wine (148 mL), or one 1 oz glass of hard liquor (44 mL). General instructions  Schedule regular health, dental, and eye exams.  Stay current with your vaccines.  Tell your health care provider if: ? You often feel depressed. ? You have ever been abused or do not feel safe at home. Summary  Adopting a healthy lifestyle and getting preventive care are important in  promoting health and wellness.  Follow your health care provider's instructions about healthy diet, exercising, and getting tested or screened for diseases.  Follow your health care provider's instructions on monitoring your cholesterol and blood pressure. This information is not intended to replace advice given to you by your health care provider. Make sure you discuss any questions you have with your health care provider. Document Revised: 11/19/2018 Document Reviewed: 11/19/2018 Elsevier Patient Education  2021 [employer].

## 2021-04-17 NOTE — Progress Notes (Signed)
Subjective:   Patient ID: Jasmine Pham, female    DOB: 06-13-1946, 75 y.o.   MRN: [id number]  HPI Here for medicare wellness and physical, no new complaints. Please see A/P for status and treatment of chronic medical problems.   Diet: heart healthy  Physical activity: sedentary Depression/mood screen: negative Hearing: intact to whispered voice, left one with moderate loss Visual acuity: grossly normal s/p cataract times 2, performs annual eye exam  ADLs: capable Fall risk: none Home safety: good Cognitive evaluation: intact to orientation, naming, recall and repetition EOL planning: adv directives discussed  Flowsheet Row Office Visit from 04/17/2021 in [facility] Total Score 0      I have personally reviewed and have noted 1. The patient's medical and social history - reviewed today no changes 2. Their use of alcohol, tobacco or illicit drugs 3. Their current medications and supplements 4. The patient's functional ability including ADL's, fall risks, home safety risks and hearing or visual impairment. 5. Diet and physical activities 6. Evidence for depression or mood disorders 7. Care team reviewed and updated 8.  The patient is not on an opioid pain medication.  Patient Care Team: Myrlene Broker, MD as PCP - General (Internal Medicine) Freddy Finner, MD (Obstetrics and Gynecology) Past Medical History:  Diagnosis Date  . ALLERGIC RHINITIS   . Anxiety   . COLONIC POLYPS, HX OF   . HYPERLIPIDEMIA   . UNSPECIFIED MENIERES DISEASE    Past Surgical History:  Procedure Laterality Date  . CATARACT EXTRACTION Bilateral 2019  . TONSILLECTOMY  1956   Family History  Problem Relation Age of Onset  . Prostate cancer Father   . Colon cancer Other   . Hyperlipidemia Other   . Kidney disease Other    Review of Systems  Constitutional: Negative.   HENT: Negative.   Eyes: Negative.   Respiratory: Negative for cough, chest  tightness and shortness of breath.   Cardiovascular: Negative for chest pain, palpitations and leg swelling.  Gastrointestinal: Negative for abdominal distention, abdominal pain, constipation, diarrhea, nausea and vomiting.  Musculoskeletal: Negative.   Skin: Negative.   Neurological: Negative.        RLS  Psychiatric/Behavioral: Negative.     Objective:  Physical Exam Constitutional:      Appearance: She is well-developed.  HENT:     Head: Normocephalic and atraumatic.  Cardiovascular:     Rate and Rhythm: Normal rate and regular rhythm.  Pulmonary:     Effort: Pulmonary effort is normal. No respiratory distress.     Breath sounds: Normal breath sounds. No wheezing or rales.  Abdominal:     General: Bowel sounds are normal. There is no distension.     Palpations: Abdomen is soft.     Tenderness: There is no abdominal tenderness. There is no rebound.  Musculoskeletal:     Cervical back: Normal range of motion.  Skin:    General: Skin is warm and dry.  Neurological:     Mental Status: She is alert and oriented to person, place, and time.     Coordination: Coordination normal.     Vitals:   04/17/21 1117  BP: 140/70  Pulse: (!) 54  Temp: 98.2 F (36.8 C)  TempSrc: Oral  SpO2: 97%  Weight: 99 lb 12.8 oz (45.3 kg)  Height: 5\' 1"  (1.549 m)   This visit occurred during the SARS-CoV-2 public health emergency.  Safety protocols were in place, including screening questions  prior to the visit, additional usage of staff PPE, and extensive cleaning of exam room while observing appropriate contact time as indicated for disinfecting solutions.   Assessment & Plan:

## 2021-04-17 NOTE — Assessment & Plan Note (Signed)
Stable takes chlorthalidone.

## 2021-04-17 NOTE — Assessment & Plan Note (Signed)
Flu shot yearly. Covid-19 4 shots. Pneumonia complete. Shingrix complete. Tetanus due 2027. Colonoscopy due 2024. Mammogram due 2022 already scheduled, pap smear aged out and dexa up to date with gyn. Counseled about sun safety and mole surveillance. Counseled about the dangers of distracted driving. Given 10 year screening recommendations.

## 2021-04-17 NOTE — Assessment & Plan Note (Signed)
Will change requip to 0.75 mg qhs and can increase if needed. Checking CBC and ferritin as symptoms have worsened.

## 2021-04-17 NOTE — Assessment & Plan Note (Signed)
Taking crestor 5 mg daily. Checking lipid panel and adjust as needed.

## 2021-05-01 ENCOUNTER — Ambulatory Visit: Payer: Medicare PPO

## 2021-05-05 ENCOUNTER — Other Ambulatory Visit: Payer: Self-pay | Admitting: Internal Medicine

## 2021-05-17 DIAGNOSIS — Z681 Body mass index (BMI) 19 or less, adult: Secondary | ICD-10-CM | POA: Diagnosis not present

## 2021-05-17 DIAGNOSIS — Z124 Encounter for screening for malignant neoplasm of cervix: Secondary | ICD-10-CM | POA: Diagnosis not present

## 2021-06-05 DIAGNOSIS — H02831 Dermatochalasis of right upper eyelid: Secondary | ICD-10-CM | POA: Diagnosis not present

## 2021-06-05 DIAGNOSIS — H02834 Dermatochalasis of left upper eyelid: Secondary | ICD-10-CM | POA: Diagnosis not present

## 2021-06-05 DIAGNOSIS — H53453 Other localized visual field defect, bilateral: Secondary | ICD-10-CM | POA: Diagnosis not present

## 2021-06-05 DIAGNOSIS — H57813 Brow ptosis, bilateral: Secondary | ICD-10-CM | POA: Diagnosis not present

## 2021-06-27 ENCOUNTER — Ambulatory Visit: Payer: Medicare PPO

## 2021-07-08 ENCOUNTER — Other Ambulatory Visit: Payer: Self-pay | Admitting: Internal Medicine

## 2021-07-19 ENCOUNTER — Other Ambulatory Visit: Payer: Self-pay

## 2021-07-19 ENCOUNTER — Ambulatory Visit
Admission: RE | Admit: 2021-07-19 | Discharge: 2021-07-19 | Disposition: A | Discharge status: Home / Self Care | Payer: Medicare PPO | Source: Ambulatory Visit | Attending: Obstetrics & Gynecology | Admitting: Obstetrics & Gynecology

## 2021-07-19 DIAGNOSIS — Z1231 Encounter for screening mammogram for malignant neoplasm of breast: Secondary | ICD-10-CM

## 2021-10-16 DIAGNOSIS — H57813 Brow ptosis, bilateral: Secondary | ICD-10-CM | POA: Diagnosis not present

## 2021-10-16 DIAGNOSIS — Z09 Encounter for follow-up examination after completed treatment for conditions other than malignant neoplasm: Secondary | ICD-10-CM | POA: Diagnosis not present

## 2021-11-06 ENCOUNTER — Ambulatory Visit: Payer: Self-pay

## 2021-11-06 ENCOUNTER — Ambulatory Visit: Payer: Medicare PPO | Admitting: Orthopaedic Surgery

## 2021-11-06 DIAGNOSIS — M25551 Pain in right hip: Secondary | ICD-10-CM | POA: Diagnosis not present

## 2021-11-06 DIAGNOSIS — M7061 Trochanteric bursitis, right hip: Secondary | ICD-10-CM

## 2021-11-06 NOTE — Progress Notes (Signed)
The patient is a 75 year old female that I am seeing for the first time as a patient but have actually replaced her husband's hips.  She comes in with about a 83-month history of right hip pain and some with the left hip may be from overcompensating.  She points to the lateral aspect of her hip on the right side a source of her pain.  She denies any radicular symptoms or any injuries.  She denies any groin pain.  She does do a lot of exercise walking.  She also sometimes wears shoes with higher heels.  She is not a diabetic.  Both hips move smoothly and fluidly with no pain in the groin at all and no pain to rotation.  There is only pain to slight palpation of the trochanteric area of both hips with the right worse than left but this is minimal.  There is a little bit of IT band pain.  I decided not to x-ray her hips today based on her clinical exam.  I did show her stretching exercises to try for both hips at least twice daily and have recommended topical Voltaren gel.  Also during her exercise walk I want her to keep it on flat surfaces.  Follow-up can be as needed.  All question concerns were answered and addressed.  If this is not improved my neck step would be to consider steroid injection.

## 2021-11-07 DIAGNOSIS — S62615A Displaced fracture of proximal phalanx of left ring finger, initial encounter for closed fracture: Secondary | ICD-10-CM | POA: Diagnosis not present

## 2021-11-21 ENCOUNTER — Telehealth (INDEPENDENT_AMBULATORY_CARE_PROVIDER_SITE_OTHER): Payer: Medicare PPO | Admitting: Family Medicine

## 2021-11-21 ENCOUNTER — Encounter: Payer: Self-pay | Admitting: Family Medicine

## 2021-11-21 VITALS — Temp 99.1°F

## 2021-11-21 DIAGNOSIS — R0989 Other specified symptoms and signs involving the circulatory and respiratory systems: Secondary | ICD-10-CM | POA: Diagnosis not present

## 2021-11-21 DIAGNOSIS — R059 Cough, unspecified: Secondary | ICD-10-CM | POA: Diagnosis not present

## 2021-11-21 MED ORDER — BENZONATATE 100 MG PO CAPS: ORAL_CAPSULE | ORAL | 0 refills | Status: DC

## 2021-11-21 MED ORDER — ALBUTEROL SULFATE HFA 108 (90 BASE) MCG/ACT IN AERS: 2.0000 | INHALATION_SPRAY | Freq: Four times a day (QID) | RESPIRATORY_TRACT | 0 refills | Status: DC | PRN

## 2021-11-21 MED ORDER — DOXYCYCLINE HYCLATE 100 MG PO TABS: 100.0000 mg | ORAL_TABLET | Freq: Two times a day (BID) | ORAL | 0 refills | Status: DC

## 2021-11-21 NOTE — Progress Notes (Signed)
Virtual Visit via Video Note  I connected with Jasmine Pham  on 11/21/21 at 10:00 AM EST by a video enabled telemedicine application and verified that I am speaking with the correct person using two identifiers.  Location patient: home, [place] Location provider:work or home office Persons participating in the virtual visit: patient, provider  I discussed the limitations of evaluation and management by telemedicine and the availability of in person appointments. The patient expressed understanding and agreed to proceed.   HPI:  Acute telemedicine visit for sore throat and congestion: -Onset:3 days ago -has had 3 negative covid tests -Symptoms include:sore throat, chest congestion, laryngitis, cough, "rattle" in the chest -Denies:fevers, NVD, CP, SOB, NVD, body aches -Has tried:throat lozenges, salt gargles -Pertinent past medical history:see below -Pertinent medication allergies: No Known Allergies -COVID-19 vaccine status: Immunization History  Administered Date(s) Administered   Influenza Split 10/03/2011, 10/21/2012, 09/23/2014   Influenza Whole 09/23/2009, 10/05/2010   Influenza, High Dose Seasonal PF 10/22/2016, 09/16/2018   Influenza-Unspecified 09/09/2013, 10/25/2015, 09/09/2021   Moderna SARS-COV2 Booster Vaccination 10/03/2020, 03/22/2021   Moderna Sars-Covid-2 Vaccination 01/12/2020, 02/11/2020, 10/03/2020, 09/19/2021   Pneumococcal Conjugate-13 04/13/2015   Pneumococcal Polysaccharide-23 03/08/2014   Td 12/10/2005   Tdap 08/02/2016, 10/09/2021   Zoster Recombinat (Shingrix) 01/24/2017, 03/24/2017    ROS: See pertinent positives and negatives per HPI.  Past Medical History:  Diagnosis Date   ALLERGIC RHINITIS    Anxiety    COLONIC POLYPS, HX OF    HYPERLIPIDEMIA    UNSPECIFIED MENIERES DISEASE     Past Surgical History:  Procedure Laterality Date   CATARACT EXTRACTION Bilateral 2019   TONSILLECTOMY  1956     Current Outpatient Medications:    albuterol (PROAIR  HFA) 108 (90 Base) MCG/ACT inhaler, Inhale 2 puffs into the lungs every 6 (six) hours as needed for wheezing or shortness of breath., Disp: 1 each, Rfl: 0   benzonatate (TESSALON PERLES) 100 MG capsule, 1-2 capsules up to twice daily, Disp: 30 capsule, Rfl: 0   Calcium Carbonate-Vitamin D 600-400 MG-UNIT tablet, Take 1 tablet by mouth daily., Disp: , Rfl:    chlorthalidone (HYGROTON) 25 MG tablet, Take 25 mg by mouth daily., Disp: , Rfl:    [employer] ALLERGY RELIEF D 60-120 MG per tablet, Take 1 tablet by mouth as needed., Disp: , Rfl: 0   doxycycline (VIBRA-TABS) 100 MG tablet, Take 1 tablet (100 mg total) by mouth 2 (two) times daily., Disp: 14 tablet, Rfl: 0   raloxifene (EVISTA) 60 MG tablet, Take 60 mg by mouth daily., Disp: , Rfl:    rOPINIRole (REQUIP) 0.25 MG tablet, TAKE 1 TABLET BY MOUTH 3 TIMES A DAY, Disp: 270 tablet, Rfl: 3   rosuvastatin (CRESTOR) 5 MG tablet, TAKE 1 TABLET BY MOUTH EVERY DAY, Disp: 90 tablet, Rfl: 3   potassium chloride (K-DUR) 10 MEQ tablet, Take 1 tablet (10 mEq total) by mouth daily., Disp: 90 tablet, Rfl: 1  EXAM:  VITALS per patient if applicable:  GENERAL: alert, oriented, appears well and in no acute distress  HEENT: atraumatic, conjunttiva clear, no obvious abnormalities on inspection of external nose and ears  NECK: normal movements of the head and neck  LUNGS: on inspection no signs of respiratory distress, breathing rate appears normal, no obvious gross SOB, gasping or wheezing or audible wheezing on video vist  CV: no obvious cyanosis  MS: moves all visible extremities without noticeable abnormality  PSYCH/NEURO: pleasant and cooperative, no obvious depression or anxiety, speech and thought processing grossly intact  ASSESSMENT AND PLAN:  Discussed the following assessment and plan:  Cough, unspecified type  Chest congestion  -we discussed possible serious and likely etiologies, options for evaluation and workup, limitations of telemedicine  visit vs in person visit, treatment, treatment risks and precautions. Pt is agreeable to treatment via telemedicine at this moment. Query VURI, Influenza, vs other. She has done several covid tests which were negative. Opted for treatment with home care measures per patient instructions, Tessalon rx for cough, trial alb for possible bronchitis with addition of doxy if worsening or not improving as expected in case of BLRI given the "rattle."  Work/School slipped offered:  declined Advised to seek prompt vv or in person care if worsening, new symptoms arise, or if is not improving with treatment. Discussed options for inperson care if PCP office not available. Did let this patient know that I do telemedicine on Tuesdays and Thursdays for [place]. Advised to schedule follow up visit with PCP or [facility] if any further questions or concerns to avoid delays in care.   I discussed the assessment and treatment plan with the patient. The patient was provided an opportunity to ask questions and all were answered. The patient agreed with the plan and demonstrated an understanding of the instructions.     Jasmine Koyanagi, DO

## 2021-11-21 NOTE — Patient Instructions (Signed)
-  I sent the medication(s) we discussed to your pharmacy: Meds ordered this encounter  Medications   benzonatate (TESSALON PERLES) 100 MG capsule    Sig: 1-2 capsules up to twice daily    Dispense:  30 capsule    Refill:  0   albuterol (PROAIR HFA) 108 (90 Base) MCG/ACT inhaler    Sig: Inhale 2 puffs into the lungs every 6 (six) hours as needed for wheezing or shortness of breath.    Dispense:  1 each    Refill:  0   doxycycline (VIBRA-TABS) 100 MG tablet    Sig: Take 1 tablet (100 mg total) by mouth 2 (two) times daily.    Dispense:  14 tablet    Refill:  0   Try nasal saline  Plenty of fluids and rest and avoid daily  Tylenol or aleve if needed for aches, pains, sore throat or fevers  Try the inhaler and the cough medication. If worsening or not improving as we discussed can start the antibiotic.   I hope you are feeling better soon!  Seek in person care promptly if your symptoms worsen, new concerns arise or you are not improving with treatment.  It was nice to meet you today. I help [facility] out with telemedicine visits on Tuesdays and Thursdays and am happy to help if you need a virtual follow up visit on those days. Otherwise, if you have any concerns or questions following this visit please schedule a follow up visit with your Primary Care office or seek care at a local urgent care clinic to avoid delays in care

## 2021-11-24 DIAGNOSIS — M79645 Pain in left finger(s): Secondary | ICD-10-CM | POA: Diagnosis not present

## 2021-11-24 DIAGNOSIS — S62615A Displaced fracture of proximal phalanx of left ring finger, initial encounter for closed fracture: Secondary | ICD-10-CM | POA: Diagnosis not present

## 2021-11-24 DIAGNOSIS — S62619A Displaced fracture of proximal phalanx of unspecified finger, initial encounter for closed fracture: Secondary | ICD-10-CM | POA: Insufficient documentation

## 2021-11-27 ENCOUNTER — Encounter (HOSPITAL_BASED_OUTPATIENT_CLINIC_OR_DEPARTMENT_OTHER): Payer: Self-pay | Admitting: Orthopedic Surgery

## 2021-11-27 ENCOUNTER — Other Ambulatory Visit: Payer: Self-pay

## 2021-11-27 DIAGNOSIS — S62615A Displaced fracture of proximal phalanx of left ring finger, initial encounter for closed fracture: Secondary | ICD-10-CM | POA: Diagnosis not present

## 2021-11-27 NOTE — Progress Notes (Signed)
Spoke w/ via phone for pre-op interview--- pt Lab needs dos---- no              Lab results------ no COVID test -----patient states asymptomatic no test needed Arrive at ------- 0730 on 11-30-2021 NPO after MN NO Solid Food.  Clear liquids from MN until--- 0630 Med rec completed  Medications to take morning of surgery ----- requip, crestor, evista Diabetic medication ----- n/a Patient instructed no nail polish to be worn day of surgery Patient instructed to bring photo id and insurance card day of surgery Patient aware to have Driver (ride ) / caregiver for 24 hours after surgery -- husband, steve Patient Special Instructions ----- n/a Pre-Op special Istructions ----- sent inbox message to dr Yehuda Budd in epic , requested orders Patient verbalized understanding of instructions that were given at this phone interview. Patient denies shortness of breath, chest pain, fever, cough at this phone interview.

## 2021-11-30 ENCOUNTER — Ambulatory Visit (HOSPITAL_BASED_OUTPATIENT_CLINIC_OR_DEPARTMENT_OTHER): Admission: RE | Admit: 2021-11-30 | Payer: Medicare PPO | Source: Home / Self Care | Admitting: Orthopedic Surgery

## 2021-11-30 HISTORY — DX: Fracture of unspecified phalanx of unspecified finger, initial encounter for closed fracture: S62.609A

## 2021-11-30 HISTORY — DX: Allergic rhinitis, unspecified: J30.9

## 2021-11-30 HISTORY — DX: Meniere's disease, unspecified ear: H81.09

## 2021-11-30 HISTORY — DX: Hyperlipidemia, unspecified: E78.5

## 2021-11-30 SURGERY — OPEN REDUCTION INTERNAL FIXATION (ORIF) PROXIMAL PHALANX
Anesthesia: Regional | Laterality: Left

## 2021-12-01 DIAGNOSIS — S62615A Displaced fracture of proximal phalanx of left ring finger, initial encounter for closed fracture: Secondary | ICD-10-CM | POA: Diagnosis not present

## 2021-12-05 DIAGNOSIS — S62615A Displaced fracture of proximal phalanx of left ring finger, initial encounter for closed fracture: Secondary | ICD-10-CM | POA: Diagnosis not present

## 2021-12-14 DIAGNOSIS — M816 Localized osteoporosis [Lequesne]: Secondary | ICD-10-CM | POA: Diagnosis not present

## 2021-12-14 DIAGNOSIS — M8580 Other specified disorders of bone density and structure, unspecified site: Secondary | ICD-10-CM | POA: Diagnosis not present

## 2021-12-21 DIAGNOSIS — S62615A Displaced fracture of proximal phalanx of left ring finger, initial encounter for closed fracture: Secondary | ICD-10-CM | POA: Diagnosis not present

## 2021-12-21 DIAGNOSIS — Z4789 Encounter for other orthopedic aftercare: Secondary | ICD-10-CM | POA: Diagnosis not present

## 2022-01-04 DIAGNOSIS — S62615D Displaced fracture of proximal phalanx of left ring finger, subsequent encounter for fracture with routine healing: Secondary | ICD-10-CM | POA: Diagnosis not present

## 2022-01-08 DIAGNOSIS — M25642 Stiffness of left hand, not elsewhere classified: Secondary | ICD-10-CM | POA: Diagnosis not present

## 2022-01-08 DIAGNOSIS — S62615A Displaced fracture of proximal phalanx of left ring finger, initial encounter for closed fracture: Secondary | ICD-10-CM | POA: Diagnosis not present

## 2022-01-22 DIAGNOSIS — S62615A Displaced fracture of proximal phalanx of left ring finger, initial encounter for closed fracture: Secondary | ICD-10-CM | POA: Diagnosis not present

## 2022-01-22 DIAGNOSIS — M25642 Stiffness of left hand, not elsewhere classified: Secondary | ICD-10-CM | POA: Diagnosis not present

## 2022-01-22 DIAGNOSIS — M79645 Pain in left finger(s): Secondary | ICD-10-CM | POA: Diagnosis not present

## 2022-01-29 DIAGNOSIS — M79645 Pain in left finger(s): Secondary | ICD-10-CM | POA: Diagnosis not present

## 2022-01-29 DIAGNOSIS — S62615A Displaced fracture of proximal phalanx of left ring finger, initial encounter for closed fracture: Secondary | ICD-10-CM | POA: Diagnosis not present

## 2022-01-29 DIAGNOSIS — M25642 Stiffness of left hand, not elsewhere classified: Secondary | ICD-10-CM | POA: Diagnosis not present

## 2022-01-30 DIAGNOSIS — S62615D Displaced fracture of proximal phalanx of left ring finger, subsequent encounter for fracture with routine healing: Secondary | ICD-10-CM | POA: Diagnosis not present

## 2022-02-14 DIAGNOSIS — Z961 Presence of intraocular lens: Secondary | ICD-10-CM | POA: Diagnosis not present

## 2022-02-14 DIAGNOSIS — H35372 Puckering of macula, left eye: Secondary | ICD-10-CM | POA: Diagnosis not present

## 2022-02-14 DIAGNOSIS — D3131 Benign neoplasm of right choroid: Secondary | ICD-10-CM | POA: Diagnosis not present

## 2022-03-06 ENCOUNTER — Encounter: Payer: Self-pay | Admitting: Internal Medicine

## 2022-03-19 ENCOUNTER — Ambulatory Visit: Payer: Medicare PPO | Admitting: Internal Medicine

## 2022-03-23 ENCOUNTER — Ambulatory Visit: Payer: Medicare PPO | Admitting: Internal Medicine

## 2022-03-23 ENCOUNTER — Encounter: Payer: Self-pay | Admitting: Internal Medicine

## 2022-03-23 DIAGNOSIS — G2581 Restless legs syndrome: Secondary | ICD-10-CM

## 2022-03-23 MED ORDER — ROPINIROLE HCL 0.25 MG PO TABS: 1.0000 mg | ORAL_TABLET | Freq: Every day | ORAL | 3 refills | Status: DC

## 2022-03-23 NOTE — Patient Instructions (Signed)
We have adjusted the prescription. ? ? ?

## 2022-03-23 NOTE — Assessment & Plan Note (Signed)
Will increase dose of requip to maximum 1 mg qhs. She prefers 0.25 mg tablets as she typically takes .75 mg qhs but sometimes needs to take 1 mg qhs. Rx done today.  ?

## 2022-03-23 NOTE — Progress Notes (Signed)
? ?  Subjective:  ? ?Patient ID: Jasmine Pham, female    DOB: 08/07/1946, 76 y.o.   MRN: [id number] ? ?HPI ?The patient has had some worsening RLS ? ?Review of Systems  ?Constitutional: Negative.   ?HENT: Negative.    ?Eyes: Negative.   ?Respiratory:  Negative for cough, chest tightness and shortness of breath.   ?Cardiovascular:  Negative for chest pain, palpitations and leg swelling.  ?Gastrointestinal:  Negative for abdominal distention, abdominal pain, constipation, diarrhea, nausea and vomiting.  ?Musculoskeletal: Negative.   ?     RLS  ?Skin: Negative.   ?Neurological: Negative.   ?Psychiatric/Behavioral: Negative.    ? ?Objective:  ?Physical Exam ?Constitutional:   ?   Appearance: She is well-developed.  ?HENT:  ?   Head: Normocephalic and atraumatic.  ?Cardiovascular:  ?   Rate and Rhythm: Normal rate and regular rhythm.  ?Pulmonary:  ?   Effort: Pulmonary effort is normal. No respiratory distress.  ?   Breath sounds: Normal breath sounds. No wheezing or rales.  ?Abdominal:  ?   General: Bowel sounds are normal. There is no distension.  ?   Palpations: Abdomen is soft.  ?   Tenderness: There is no abdominal tenderness. There is no rebound.  ?Musculoskeletal:  ?   Cervical back: Normal range of motion.  ?Skin: ?   General: Skin is warm and dry.  ?Neurological:  ?   Mental Status: She is alert and oriented to person, place, and time.  ?   Coordination: Coordination normal.  ? ? ?Vitals:  ? 03/23/22 1502  ?BP: 118/70  ?Pulse: 66  ?Resp: 18  ?SpO2: 97%  ?Weight: 101 lb 6.4 oz (46 kg)  ?Height: 5\' 1"  (1.549 m)  ? ? ?This visit occurred during the SARS-CoV-2 public health emergency.  Safety protocols were in place, including screening questions prior to the visit, additional usage of staff PPE, and extensive cleaning of exam room while observing appropriate contact time as indicated for disinfecting solutions.  ? ?Assessment & Plan:  ? ?

## 2022-04-20 ENCOUNTER — Ambulatory Visit: Payer: Medicare PPO

## 2022-04-23 ENCOUNTER — Encounter: Payer: Self-pay | Admitting: Internal Medicine

## 2022-04-23 ENCOUNTER — Ambulatory Visit (INDEPENDENT_AMBULATORY_CARE_PROVIDER_SITE_OTHER): Payer: Medicare PPO | Admitting: Internal Medicine

## 2022-04-23 VITALS — BP 122/70 | HR 51 | Resp 18 | Ht 61.0 in | Wt 102.6 lb

## 2022-04-23 DIAGNOSIS — G2581 Restless legs syndrome: Secondary | ICD-10-CM

## 2022-04-23 DIAGNOSIS — Z0000 Encounter for general adult medical examination without abnormal findings: Secondary | ICD-10-CM

## 2022-04-23 DIAGNOSIS — E782 Mixed hyperlipidemia: Secondary | ICD-10-CM | POA: Diagnosis not present

## 2022-04-23 DIAGNOSIS — H8102 Meniere's disease, left ear: Secondary | ICD-10-CM

## 2022-04-23 LAB — CBC
HCT: 41.8 % (ref 36.0–46.0)
Hemoglobin: 14.4 g/dL (ref 12.0–15.0)
MCHC: 34.4 g/dL (ref 30.0–36.0)
MCV: 90.5 fl (ref 78.0–100.0)
Platelets: 257 10*3/uL (ref 150.0–400.0)
RBC: 4.61 Mil/uL (ref 3.87–5.11)
RDW: 13.3 % (ref 11.5–15.5)
WBC: 5.6 10*3/uL (ref 4.0–10.5)

## 2022-04-23 LAB — FERRITIN: Ferritin: 54.5 ng/mL (ref 10.0–291.0)

## 2022-04-23 NOTE — Progress Notes (Signed)
? ?Subjective:  ? ?Patient ID: Jasmine Pham, female    DOB: 03/10/1946, 76 y.o.   MRN: [id number] ? ?HPI ?Here for medicare wellness and physical, no new complaints. Please see A/P for status and treatment of chronic medical problems.  ? ?Diet: heart ?Physical activity: sedentary ?Depression/mood screen: negative ?Hearing: intact to whispered voice, mild loss bilaterally ?Visual acuity: grossly normal, performs annual eye exam  ?ADLs: capable ?Fall risk: none ?Home safety: good ?Cognitive evaluation: intact to orientation, naming, recall and repetition ?EOL planning: adv directives discussed ? ?[facility] Office Visit from 04/23/2022 in [facility] at [place]  ?PHQ-2 Total Score 0  ? ?  ?  ?[facility] Office Visit from 04/23/2022 in [facility] at [place]  ?PHQ-9 Total Score 0  ? ?  ? ? ?  08/02/2016  ? 10:57 AM 01/27/2019  ? 10:37 AM 03/30/2020  ? 10:05 AM 04/17/2021  ? 11:19 AM 04/23/2022  ?  3:41 PM  ?Fall Risk  ?Falls in the past year? No 0 0 0 0  ?Was there an injury with Fall?   0 0 0  ?Fall Risk Category Calculator   0 0 0  ?Fall Risk Category   Low Low Low  ?Patient Fall Risk Level   Low fall risk Low fall risk   ?Patient at Risk for Falls Due to    No Fall Risks   ?Fall risk Follow up    Falls evaluation completed   ? ? ?I have personally reviewed and have noted ?1. The patient's medical and social history - reviewed today no changes ?2. Their use of alcohol, tobacco or illicit drugs ?3. Their current medications and supplements ?4. The patient's functional ability including ADL's, fall risks, home safety risks and hearing or visual impairment. ?5. Diet and physical activities ?6. Evidence for depression or mood disorders ?7. Care team reviewed and updated ?8.  The patient is not on an opioid pain medication. ? ?Patient Care Team: ?Hoyt Koch, MD as PCP - General (Internal Medicine) ?Maisie Fus, MD (Obstetrics and Gynecology) ?Past Medical History:  ?Diagnosis Date  ?  Allergic rhinitis   ? Anxiety   ? Finger fracture, left   ? ring  ? Hyperlipidemia   ? Meniere's disease, unspecified ear   ? Personal history of colonic polyps   ? ?Past Surgical History:  ?Procedure Laterality Date  ? CATARACT EXTRACTION W/ INTRAOCULAR LENS IMPLANT Bilateral 2019  ? COLONOSCOPY  07/2018  ? TONSILLECTOMY  1956  ? ?Family History  ?Problem Relation Age of Onset  ? Prostate cancer Father   ? Colon cancer Other   ? Hyperlipidemia Other   ? Kidney disease Other   ? ?Review of Systems  ?Constitutional: Negative.   ?HENT: Negative.    ?Eyes: Negative.   ?Respiratory:  Negative for cough, chest tightness and shortness of breath.   ?Cardiovascular:  Negative for chest pain, palpitations and leg swelling.  ?Gastrointestinal:  Negative for abdominal distention, abdominal pain, constipation, diarrhea, nausea and vomiting.  ?Musculoskeletal: Negative.   ?Skin: Negative.   ?Neurological: Negative.   ?Psychiatric/Behavioral: Negative.    ? ?Objective:  ?Physical Exam ?Constitutional:   ?   Appearance: She is well-developed.  ?HENT:  ?   Head: Normocephalic and atraumatic.  ?Cardiovascular:  ?   Rate and Rhythm: Normal rate and regular rhythm.  ?Pulmonary:  ?   Effort: Pulmonary effort is normal. No respiratory distress.  ?   Breath sounds: Normal breath sounds. No  wheezing or rales.  ?Abdominal:  ?   General: Bowel sounds are normal. There is no distension.  ?   Palpations: Abdomen is soft.  ?   Tenderness: There is no abdominal tenderness. There is no rebound.  ?Musculoskeletal:     ?   General: Tenderness present.  ?   Cervical back: Normal range of motion.  ?Skin: ?   General: Skin is warm and dry.  ?Neurological:  ?   Mental Status: She is alert and oriented to person, place, and time.  ?   Coordination: Coordination normal.  ? ? ?Vitals:  ? 04/23/22 1537  ?BP: 122/70  ?Pulse: (!) 51  ?Resp: 18  ?SpO2: 97%  ?Weight: 102 lb 9.6 oz (46.5 kg)  ?Height: 5\' 1"  (1.549 m)  ? ?Assessment & Plan:  ? ?

## 2022-04-24 ENCOUNTER — Ambulatory Visit: Payer: Medicare PPO

## 2022-04-24 VITALS — Ht 61.0 in | Wt 102.0 lb

## 2022-04-24 DIAGNOSIS — Z0000 Encounter for general adult medical examination without abnormal findings: Secondary | ICD-10-CM

## 2022-04-24 LAB — COMPREHENSIVE METABOLIC PANEL
ALT: 12 U/L (ref 0–35)
AST: 20 U/L (ref 0–37)
Albumin: 4.2 g/dL (ref 3.5–5.2)
Alkaline Phosphatase: 85 U/L (ref 39–117)
BUN: 17 mg/dL (ref 6–23)
CO2: 29 mEq/L (ref 19–32)
Calcium: 9.5 mg/dL (ref 8.4–10.5)
Chloride: 101 mEq/L (ref 96–112)
Creatinine, Ser: 0.72 mg/dL (ref 0.40–1.20)
GFR: 82.61 mL/min (ref >60.00)
Glucose, Bld: 84 mg/dL (ref 70–99)
Potassium: 3.9 mEq/L (ref 3.5–5.1)
Sodium: 140 mEq/L (ref 135–145)
Total Bilirubin: 1 mg/dL (ref 0.2–1.2)
Total Protein: 6.7 g/dL (ref 6.0–8.3)

## 2022-04-24 LAB — LIPID PANEL
Cholesterol: 271 mg/dL — ABNORMAL HIGH (ref 0–200)
HDL: 77.7 mg/dL (ref >39.00)
LDL Cholesterol: 160 mg/dL — ABNORMAL HIGH (ref 0–99)
NonHDL: 193.46
Total CHOL/HDL Ratio: 3
Triglycerides: 169 mg/dL — ABNORMAL HIGH (ref 0.0–149.0)
VLDL: 33.8 mg/dL (ref 0.0–40.0)

## 2022-04-24 NOTE — Patient Instructions (Addendum)
[name] , ?Thank you for taking time to come for your Medicare Wellness Visit. I appreciate your ongoing commitment to your health goals. Please review the following plan we discussed and let me know if I can assist you in the future.  ? ?Screening recommendations/referrals: ?Colonoscopy: 07/29/18 ?Mammogram: 07/19/21 ?Bone Density: had this Fall, doesn't know date ?Recommended yearly ophthalmology/optometry visit for glaucoma screening and checkup ?Recommended yearly dental visit for hygiene and checkup ? ?Vaccinations: ?Influenza vaccine: 09/09/21 ?Pneumococcal vaccine: 04/13/15 ?Tdap vaccine: 10/09/21 ?Shingles vaccine: Shingrix 01/24/17, 03/24/17 Zostavax 08/03/08   ?Covid-19:01/12/20, 02/11/20, 10/03/20, 03/22/21, 09/19/21 ? ?Advanced directives: no ? ?Conditions/risks identified: none ? ?Next appointment: Follow up in one year for your annual wellness visit  ? ? ?Preventive Care 63 Years and Older, Female ?Preventive care refers to lifestyle choices and visits with your health care provider that can promote health and wellness. ?What does preventive care include? ?A yearly physical exam. This is also called an annual well check. ?Dental exams once or twice a year. ?Routine eye exams. Ask your health care provider how often you should have your eyes checked. ?Personal lifestyle choices, including: ?Daily care of your teeth and gums. ?Regular physical activity. ?Eating a healthy diet. ?Avoiding tobacco and drug use. ?Limiting alcohol use. ?Practicing safe sex. ?Taking low-dose aspirin every day. ?Taking vitamin and mineral supplements as recommended by your health care provider. ?What happens during an annual well check? ?The services and screenings done by your health care provider during your annual well check will depend on your age, overall health, lifestyle risk factors, and family history of disease. ?Counseling  ?Your health care provider may ask you questions about your: ?Alcohol use. ?Tobacco use. ?Drug  use. ?Emotional well-being. ?Home and relationship well-being. ?Sexual activity. ?Eating habits. ?History of falls. ?Memory and ability to understand (cognition). ?Work and work Astronomer. ?Reproductive health. ?Screening  ?You may have the following tests or measurements: ?Height, weight, and BMI. ?Blood pressure. ?Lipid and cholesterol levels. These may be checked every 5 years, or more frequently if you are over 75 years old. ?Skin check. ?Lung cancer screening. You may have this screening every year starting at age 53 if you have a 30-pack-year history of smoking and currently smoke or have quit within the past 15 years. ?Fecal occult blood test (FOBT) of the stool. You may have this test every year starting at age 2. ?Flexible sigmoidoscopy or colonoscopy. You may have a sigmoidoscopy every 5 years or a colonoscopy every 10 years starting at age 43. ?Hepatitis C blood test. ?Hepatitis B blood test. ?Sexually transmitted disease (STD) testing. ?Diabetes screening. This is done by checking your blood sugar (glucose) after you have not eaten for a while (fasting). You may have this done every 1-3 years. ?Bone density scan. This is done to screen for osteoporosis. You may have this done starting at age 78. ?Mammogram. This may be done every 1-2 years. Talk to your health care provider about how often you should have regular mammograms. ?Talk with your health care provider about your test results, treatment options, and if necessary, the need for more tests. ?Vaccines  ?Your health care provider may recommend certain vaccines, such as: ?Influenza vaccine. This is recommended every year. ?Tetanus, diphtheria, and acellular pertussis (Tdap, Td) vaccine. You may need a Td booster every 10 years. ?Zoster vaccine. You may need this after age 61. ?Pneumococcal 13-valent conjugate (PCV13) vaccine. One dose is recommended after age 15. ?Pneumococcal polysaccharide (PPSV23) vaccine. One dose is recommended after age  36. ?Talk to your health care provider about which screenings and vaccines you need and how often you need them. ?This information is not intended to replace advice given to you by your health care provider. Make sure you discuss any questions you have with your health care provider. ?Document Released: 12/23/2015 Document Revised: 08/15/2016 Document Reviewed: 09/27/2015 ?Elsevier Interactive Patient Education ? 2017 [place]. ? ?Fall Prevention in the Home ?Falls can cause injuries. They can happen to people of all ages. There are many things you can do to make your home safe and to help prevent falls. ?What can I do on the outside of my home? ?Regularly fix the edges of walkways and driveways and fix any cracks. ?Remove anything that might make you trip as you walk through a door, such as a raised step or threshold. ?Trim any bushes or trees on the path to your home. ?Use bright outdoor lighting. ?Clear any walking paths of anything that might make someone trip, such as rocks or tools. ?Regularly check to see if handrails are loose or broken. Make sure that both sides of any steps have handrails. ?Any raised decks and porches should have guardrails on the edges. ?Have any leaves, snow, or ice cleared regularly. ?Use sand or salt on walking paths during winter. ?Clean up any spills in your garage right away. This includes oil or grease spills. ?What can I do in the bathroom? ?Use night lights. ?Install grab bars by the toilet and in the tub and shower. Do not use towel bars as grab bars. ?Use non-skid mats or decals in the tub or shower. ?If you need to sit down in the shower, use a plastic, non-slip stool. ?Keep the floor dry. Clean up any water that spills on the floor as soon as it happens. ?Remove soap buildup in the tub or shower regularly. ?Attach bath mats securely with double-sided non-slip rug tape. ?Do not have throw rugs and other things on the floor that can make you trip. ?What can I do in the  bedroom? ?Use night lights. ?Make sure that you have a light by your bed that is easy to reach. ?Do not use any sheets or blankets that are too big for your bed. They should not hang down onto the floor. ?Have a firm chair that has side arms. You can use this for support while you get dressed. ?Do not have throw rugs and other things on the floor that can make you trip. ?What can I do in the kitchen? ?Clean up any spills right away. ?Avoid walking on wet floors. ?Keep items that you use a lot in easy-to-reach places. ?If you need to reach something above you, use a strong step stool that has a grab bar. ?Keep electrical cords out of the way. ?Do not use floor polish or wax that makes floors slippery. If you must use wax, use non-skid floor wax. ?Do not have throw rugs and other things on the floor that can make you trip. ?What can I do with my stairs? ?Do not leave any items on the stairs. ?Make sure that there are handrails on both sides of the stairs and use them. Fix handrails that are broken or loose. Make sure that handrails are as long as the stairways. ?Check any carpeting to make sure that it is firmly attached to the stairs. Fix any carpet that is loose or worn. ?Avoid having throw rugs at the top or bottom of the stairs. If you do have throw  rugs, attach them to the floor with carpet tape. ?Make sure that you have a light switch at the top of the stairs and the bottom of the stairs. If you do not have them, ask someone to add them for you. ?What else can I do to help prevent falls? ?Wear shoes that: ?Do not have high heels. ?Have rubber bottoms. ?Are comfortable and fit you well. ?Are closed at the toe. Do not wear sandals. ?If you use a stepladder: ?Make sure that it is fully opened. Do not climb a closed stepladder. ?Make sure that both sides of the stepladder are locked into place. ?Ask someone to hold it for you, if possible. ?Clearly mark and make sure that you can see: ?Any grab bars or  handrails. ?First and last steps. ?Where the edge of each step is. ?Use tools that help you move around (mobility aids) if they are needed. These include: ?Canes. ?Walkers. ?Scooters. ?Crutches. ?Turn on the lights when you go in

## 2022-04-24 NOTE — Assessment & Plan Note (Signed)
Taking chlorthalidone and no new vertigo episodes and hearing stable. Checking CMP for any electrolyte abnormality.  ?

## 2022-04-24 NOTE — Assessment & Plan Note (Signed)
Checking CBC and ferritin to ensure no new iron deficiency. Symptoms stable on up to 1 mg requip at night time and will continue. No significant daytime symptoms. ?

## 2022-04-24 NOTE — Assessment & Plan Note (Signed)
Checking lipid panel and adjust crestor 5 mg daily as needed. 

## 2022-04-24 NOTE — Progress Notes (Cosign Needed)
Virtual Visit via Telephone Note  I connected with  Jasmine Pham on 04/24/22 at  9:45 AM EDT by telephone and verified that I am speaking with the correct person using two identifiers.  Location: Patient: home Provider: [facility] Persons participating in the virtual visit: patient/Nurse Health Advisor   I discussed the limitations, risks, security and privacy concerns of performing an evaluation and management service by telephone and the availability of in person appointments. The patient expressed understanding and agreed to proceed.  Interactive audio and video telecommunications were attempted between this nurse and patient, however failed, due to patient having technical difficulties OR patient did not have access to video capability.  We continued and completed visit with audio only.  Some vital signs may be absent or patient reported.   Hal Hope, LPN  Subjective:   Jasmine Pham is a 76 y.o. female who presents for Medicare Annual (Subsequent) preventive examination.  Review of Systems           Objective:    There were no vitals filed for this visit. There is no height or weight on file to calculate BMI.      View : No data to display.          Current Medications (verified) Outpatient Encounter Medications as of 04/24/2022  Medication Sig   Calcium Carbonate-Vitamin D 600-400 MG-UNIT tablet Take 1 tablet by mouth daily.   chlorthalidone (HYGROTON) 25 MG tablet Take 25 mg by mouth daily.   [employer] ALLERGY RELIEF D 60-120 MG per tablet Take 1 tablet by mouth as needed.   potassium chloride (K-DUR) 10 MEQ tablet Take 1 tablet (10 mEq total) by mouth daily.   raloxifene (EVISTA) 60 MG tablet Take 60 mg by mouth daily.   rOPINIRole (REQUIP) 0.25 MG tablet Take 4 tablets (1 mg total) by mouth at bedtime.   rosuvastatin (CRESTOR) 5 MG tablet TAKE 1 TABLET BY MOUTH EVERY DAY (Patient taking differently: Take 5 mg by mouth daily.)   No facility-administered  encounter medications on file as of 04/24/2022.    Allergies (verified) Patient has no known allergies.   History: Past Medical History:  Diagnosis Date   Allergic rhinitis    Anxiety    Finger fracture, left    ring   Hyperlipidemia    Meniere's disease, unspecified ear    Personal history of colonic polyps    Past Surgical History:  Procedure Laterality Date   CATARACT EXTRACTION W/ INTRAOCULAR LENS IMPLANT Bilateral 2019   COLONOSCOPY  07/2018   TONSILLECTOMY  1956   Family History  Problem Relation Age of Onset   Prostate cancer Father    Colon cancer Other    Hyperlipidemia Other    Kidney disease Other    Social History   Socioeconomic History   Marital status: Married    Spouse name: Not on file   Number of children: Not on file   Years of education: Not on file   Highest education level: Not on file  Occupational History   Not on file  Tobacco Use   Smoking status: Never   Smokeless tobacco: Never  Vaping Use   Vaping Use: Never used  Substance and Sexual Activity   Alcohol use: Yes    Comment: occasional   Drug use: Never   Sexual activity: Not on file  Other Topics Concern   Not on file  Social History Narrative   Not on file   Social Determinants of Health  Financial Resource Strain: Not on file  Food Insecurity: Not on file  Transportation Needs: Not on file  Physical Activity: Not on file  Stress: Not on file  Social Connections: Not on file    Tobacco Counseling Counseling given: Not Answered   Clinical Intake:  Pre-visit preparation completed: Yes  Pain : No/denies pain     Nutritional Risks: None Diabetes: No  How often do you need to have someone help you when you read instructions, pamphlets, or other written materials from your doctor or pharmacy?: 1 - Never  Diabetic?no  Interpreter Needed?: No  Information entered by :: Kennedy Bucker, LPN   Activities of Daily Living     View : No data to display.           Patient Care Team: Myrlene Broker, MD as PCP - General (Internal Medicine) Freddy Finner, MD (Obstetrics and Gynecology)  Indicate any recent Medical Services you may have received from other than [facility] providers in the past year (date may be approximate).     Assessment:   This is a routine wellness examination for Jasmine Pham.  Hearing/Vision screen No results found.  Dietary issues and exercise activities discussed:     Goals Addressed   None    Depression Screen    04/23/2022    3:41 PM 04/17/2021   11:19 AM 03/30/2020   10:05 AM 01/27/2019   10:37 AM 01/02/2018    1:32 PM 08/02/2016   10:57 AM 03/08/2014   10:53 AM  PHQ 2/9 Scores  PHQ - 2 Score 0 0 0 0 0 0 0  PHQ- 9 Score 0          Fall Risk    04/23/2022    3:41 PM 04/17/2021   11:19 AM 03/30/2020   10:05 AM 01/27/2019   10:37 AM 08/02/2016   10:57 AM  Fall Risk   Falls in the past year? 0 0 0 0 No  Number falls in past yr: 0 0 0    Injury with Fall? 0 0 0    Risk for fall due to :  No Fall Risks     Follow up  Falls evaluation completed       FALL RISK PREVENTION PERTAINING TO THE HOME:  Any stairs in or around the home? Yes  If so, are there any without handrails? No  Home free of loose throw rugs in walkways, pet beds, electrical cords, etc? Yes  Adequate lighting in your home to reduce risk of falls? Yes   ASSISTIVE DEVICES UTILIZED TO PREVENT FALLS:  Life alert? No  Use of a cane, walker or w/c? No  Grab bars in the bathroom? Yes  Shower chair or bench in shower? No  Elevated toilet seat or a handicapped toilet? No   Cognitive Function: refused to do test        Immunizations Immunization History  Administered Date(s) Administered   Influenza Split 10/03/2011, 10/21/2012, 09/23/2014   Influenza Whole 09/23/2009, 10/05/2010   Influenza, High Dose Seasonal PF 10/22/2016, 09/16/2018   Influenza-Unspecified 09/09/2013, 10/25/2015, 09/09/2021   Moderna SARS-COV2 Booster Vaccination  10/03/2020, 03/22/2021   Moderna Sars-Covid-2 Vaccination 01/12/2020, 02/11/2020, 10/03/2020, 09/19/2021   Pneumococcal Conjugate-13 04/13/2015   Pneumococcal Polysaccharide-23 03/08/2014   Td 12/10/2005   Tdap 08/02/2016, 10/09/2021   Zoster Recombinat (Shingrix) 01/24/2017, 03/24/2017    TDAP status: Up to date  Flu Vaccine status: Up to date  Pneumococcal vaccine status: Up to date  Covid-19 vaccine status:  Completed vaccines  Qualifies for Shingles Vaccine? Yes   Zostavax completed Yes   Shingrix Completed?: Yes  Screening Tests Health Maintenance  Topic Date Due   COVID-19 Vaccine (5 - Booster for Moderna series) 11/14/2021   INFLUENZA VACCINE  07/10/2022   MAMMOGRAM  07/20/2023   COLONOSCOPY (Pts 45-85yrs Insurance coverage will need to be confirmed)  07/30/2023   TETANUS/TDAP  10/10/2031   Pneumonia Vaccine 43+ Years old  Completed   DEXA SCAN  Completed   Hepatitis C Screening  Completed   Zoster Vaccines- Shingrix  Completed   HPV VACCINES  Aged Out    Health Maintenance  Health Maintenance Due  Topic Date Due   COVID-19 Vaccine (5 - Booster for Moderna series) 11/14/2021    Colorectal cancer screening: Type of screening: Colonoscopy. Completed 07/29/18. Repeat every 5 years  Mammogram status: Completed 07/19/21. Repeat every year  Bone Density status: Completed this Fall(doesn't know date). Results reflect: Bone density results: NORMAL. Repeat every 5 years.  Lung Cancer Screening: (Low Dose CT Chest recommended if Age 50-80 years, 30 pack-year currently smoking OR have quit w/in 15years.) does not qualify.   Additional Screening:  Hepatitis C Screening: does qualify; Completed 08/02/16  Vision Screening: Recommended annual ophthalmology exams for early detection of glaucoma and other disorders of the eye. Is the patient up to date with their annual eye exam?  Yes  Who is the provider or what is the name of the office in which the patient attends annual  eye exams? Dr.Groat If pt is not established with a provider, would they like to be referred to a provider to establish care? No .   Dental Screening: Recommended annual dental exams for proper oral hygiene  Community Resource Referral / Chronic Care Management: CRR required this visit?  No   CCM required this visit?  No      Plan:     I have personally reviewed and noted the following in the patient's chart:   Medical and social history Use of alcohol, tobacco or illicit drugs  Current medications and supplements including opioid prescriptions.  Functional ability and status Nutritional status Physical activity Advanced directives List of other physicians Hospitalizations, surgeries, and ER visits in previous 12 months Vitals Screenings to include cognitive, depression, and falls Referrals and appointments  In addition, I have reviewed and discussed with patient certain preventive protocols, quality metrics, and best practice recommendations. A written personalized care plan for preventive services as well as general preventive health recommendations were provided to patient.     Hal Hope, LPN   9/60/4540   Nurse Notes: none

## 2022-04-24 NOTE — Assessment & Plan Note (Signed)
Flu shot yearly. Covid-19 counseled. Pneumonia complete. Shingrix complete. Tetanus due 2032. Colonoscopy due 2024. Mammogram due 2024, pap smear aged out and dexa done recently due 2026-2028. Counseled about sun safety and mole surveillance. Counseled about the dangers of distracted driving. Given 10 year screening recommendations.  ? ?

## 2022-07-03 ENCOUNTER — Other Ambulatory Visit: Payer: Self-pay | Admitting: Internal Medicine

## 2022-07-03 DIAGNOSIS — Z1231 Encounter for screening mammogram for malignant neoplasm of breast: Secondary | ICD-10-CM

## 2022-07-18 DIAGNOSIS — L820 Inflamed seborrheic keratosis: Secondary | ICD-10-CM | POA: Diagnosis not present

## 2022-07-18 DIAGNOSIS — Z85828 Personal history of other malignant neoplasm of skin: Secondary | ICD-10-CM | POA: Diagnosis not present

## 2022-07-20 ENCOUNTER — Encounter: Payer: Self-pay | Admitting: Internal Medicine

## 2022-07-24 ENCOUNTER — Ambulatory Visit
Admission: RE | Admit: 2022-07-24 | Discharge: 2022-07-24 | Disposition: A | Discharge status: Home / Self Care | Payer: Medicare PPO | Source: Ambulatory Visit | Attending: Internal Medicine | Admitting: Internal Medicine

## 2022-07-24 DIAGNOSIS — Z1231 Encounter for screening mammogram for malignant neoplasm of breast: Secondary | ICD-10-CM | POA: Diagnosis not present

## 2022-08-09 DIAGNOSIS — N760 Acute vaginitis: Secondary | ICD-10-CM | POA: Diagnosis not present

## 2022-08-09 DIAGNOSIS — Z681 Body mass index (BMI) 19 or less, adult: Secondary | ICD-10-CM | POA: Diagnosis not present

## 2022-08-09 DIAGNOSIS — Z1151 Encounter for screening for human papillomavirus (HPV): Secondary | ICD-10-CM | POA: Diagnosis not present

## 2022-08-09 DIAGNOSIS — Z124 Encounter for screening for malignant neoplasm of cervix: Secondary | ICD-10-CM | POA: Diagnosis not present

## 2022-09-18 ENCOUNTER — Other Ambulatory Visit: Payer: Self-pay | Admitting: Internal Medicine

## 2022-11-13 DIAGNOSIS — H8102 Meniere's disease, left ear: Secondary | ICD-10-CM | POA: Diagnosis not present

## 2022-12-04 DIAGNOSIS — H903 Sensorineural hearing loss, bilateral: Secondary | ICD-10-CM | POA: Diagnosis not present

## 2023-02-07 ENCOUNTER — Other Ambulatory Visit: Payer: Self-pay | Admitting: Internal Medicine

## 2023-02-21 DIAGNOSIS — Z961 Presence of intraocular lens: Secondary | ICD-10-CM | POA: Diagnosis not present

## 2023-02-21 DIAGNOSIS — Z9889 Other specified postprocedural states: Secondary | ICD-10-CM | POA: Diagnosis not present

## 2023-02-21 DIAGNOSIS — D3131 Benign neoplasm of right choroid: Secondary | ICD-10-CM | POA: Diagnosis not present

## 2023-02-21 DIAGNOSIS — H35372 Puckering of macula, left eye: Secondary | ICD-10-CM | POA: Diagnosis not present

## 2023-02-21 DIAGNOSIS — D3192 Benign neoplasm of unspecified part of left eye: Secondary | ICD-10-CM | POA: Diagnosis not present

## 2023-02-26 DIAGNOSIS — D225 Melanocytic nevi of trunk: Secondary | ICD-10-CM | POA: Diagnosis not present

## 2023-02-26 DIAGNOSIS — L821 Other seborrheic keratosis: Secondary | ICD-10-CM | POA: Diagnosis not present

## 2023-02-26 DIAGNOSIS — L820 Inflamed seborrheic keratosis: Secondary | ICD-10-CM | POA: Diagnosis not present

## 2023-02-26 DIAGNOSIS — D2261 Melanocytic nevi of right upper limb, including shoulder: Secondary | ICD-10-CM | POA: Diagnosis not present

## 2023-02-26 DIAGNOSIS — Z85828 Personal history of other malignant neoplasm of skin: Secondary | ICD-10-CM | POA: Diagnosis not present

## 2023-02-26 DIAGNOSIS — L812 Freckles: Secondary | ICD-10-CM | POA: Diagnosis not present

## 2023-02-26 DIAGNOSIS — D2262 Melanocytic nevi of left upper limb, including shoulder: Secondary | ICD-10-CM | POA: Diagnosis not present

## 2023-03-20 DIAGNOSIS — H903 Sensorineural hearing loss, bilateral: Secondary | ICD-10-CM | POA: Diagnosis not present

## 2023-07-12 ENCOUNTER — Other Ambulatory Visit: Payer: Self-pay | Admitting: Internal Medicine

## 2023-07-12 DIAGNOSIS — Z1231 Encounter for screening mammogram for malignant neoplasm of breast: Secondary | ICD-10-CM

## 2023-07-22 ENCOUNTER — Ambulatory Visit (INDEPENDENT_AMBULATORY_CARE_PROVIDER_SITE_OTHER): Payer: Medicare PPO | Admitting: Internal Medicine

## 2023-07-22 ENCOUNTER — Encounter: Payer: Self-pay | Admitting: Internal Medicine

## 2023-07-22 VITALS — BP 136/84 | HR 60 | Temp 98.9°F | Ht 61.0 in | Wt 103.1 lb

## 2023-07-22 DIAGNOSIS — H8102 Meniere's disease, left ear: Secondary | ICD-10-CM

## 2023-07-22 DIAGNOSIS — Z86010 Personal history of colonic polyps: Secondary | ICD-10-CM

## 2023-07-22 DIAGNOSIS — E782 Mixed hyperlipidemia: Secondary | ICD-10-CM

## 2023-07-22 DIAGNOSIS — Z0000 Encounter for general adult medical examination without abnormal findings: Secondary | ICD-10-CM

## 2023-07-22 DIAGNOSIS — G2581 Restless legs syndrome: Secondary | ICD-10-CM

## 2023-07-22 LAB — COMPREHENSIVE METABOLIC PANEL
ALT: 10 U/L (ref 0–35)
AST: 19 U/L (ref 0–37)
Albumin: 4.2 g/dL (ref 3.5–5.2)
Alkaline Phosphatase: 84 U/L (ref 39–117)
BUN: 15 mg/dL (ref 6–23)
CO2: 32 mEq/L (ref 19–32)
Calcium: 9.8 mg/dL (ref 8.4–10.5)
Chloride: 98 mEq/L (ref 96–112)
Creatinine, Ser: 0.77 mg/dL (ref 0.40–1.20)
GFR: 75.55 mL/min (ref >60.00)
Glucose, Bld: 85 mg/dL (ref 70–99)
Potassium: 4.5 mEq/L (ref 3.5–5.1)
Sodium: 137 mEq/L (ref 135–145)
Total Bilirubin: 1.2 mg/dL (ref 0.2–1.2)
Total Protein: 6.8 g/dL (ref 6.0–8.3)

## 2023-07-22 LAB — LIPID PANEL
Cholesterol: 234 mg/dL — ABNORMAL HIGH (ref 0–200)
HDL: 79.4 mg/dL (ref >39.00)
LDL Cholesterol: 131 mg/dL — ABNORMAL HIGH (ref 0–99)
NonHDL: 154.93
Total CHOL/HDL Ratio: 3
Triglycerides: 120 mg/dL (ref 0.0–149.0)
VLDL: 24 mg/dL (ref 0.0–40.0)

## 2023-07-22 LAB — CBC
HCT: 43.2 % (ref 36.0–46.0)
Hemoglobin: 14.6 g/dL (ref 12.0–15.0)
MCHC: 33.8 g/dL (ref 30.0–36.0)
MCV: 91.6 fl (ref 78.0–100.0)
Platelets: 272 10*3/uL (ref 150.0–400.0)
RBC: 4.72 Mil/uL (ref 3.87–5.11)
RDW: 13.1 % (ref 11.5–15.5)
WBC: 5.3 10*3/uL (ref 4.0–10.5)

## 2023-07-22 NOTE — Progress Notes (Unsigned)
Subjective:   Patient ID: Jasmine Pham, female    DOB: 16-Jul-1946, 77 y.o.   MRN: [id number]  HPI Here for medicare wellness and physical, no new complaints. Please see A/P for status and treatment of chronic medical problems.   Diet: heart healthy Physical activity: active Depression/mood screen: negative Hearing: intact to whispered voice, hearing aids present Visual acuity: grossly normal, performs annual eye exam  ADLs: capable Fall risk: none Home safety: good Cognitive evaluation: intact to orientation, naming, recall and repetition EOL planning: adv directives discussed  Flowsheet Row Office Visit from 07/22/2023 in [facility] [place]  PHQ-2 Total Score 0       Flowsheet Row Clinical Support from 04/24/2022 in [facility] [place]  PHQ-9 Total Score 0         03/30/2020   10:05 AM 04/17/2021   11:19 AM 04/23/2022    3:41 PM 04/24/2022    9:52 AM 07/24/2023    8:03 AM  Fall Risk  Falls in the past year? 0 0 0 0 0  Was there an injury with Fall? 0 0 0 0   Fall Risk Category Calculator 0 0 0 0   Fall Risk Category (Retired) Low Low Low Low   (RETIRED) Patient Fall Risk Level Low fall risk Low fall risk  Low fall risk   Patient at Risk for Falls Due to  No Fall Risks  No Fall Risks   Fall risk Follow up  Falls evaluation completed  Falls evaluation completed     I have personally reviewed and have noted 1. The patient's medical and social history - reviewed today no changes 2. Their use of alcohol, tobacco or illicit drugs 3. Their current medications and supplements 4. The patient's functional ability including ADL's, fall risks, home safety risks and hearing or visual impairment. 5. Diet and physical activities 6. Evidence for depression or mood disorders 7. Care team reviewed and updated 8.  The patient is not on an opioid pain medication.  Patient Care Team: Myrlene Broker, MD as PCP - General  (Internal Medicine) Freddy Finner, MD (Inactive) (Obstetrics and Gynecology) Past Medical History:  Diagnosis Date   Allergic rhinitis    Anxiety    Finger fracture, left    ring   Hyperlipidemia    Meniere's disease, unspecified ear    Personal history of colonic polyps    Past Surgical History:  Procedure Laterality Date   CATARACT EXTRACTION W/ INTRAOCULAR LENS IMPLANT Bilateral 2019   COLONOSCOPY  07/2018   TONSILLECTOMY  1956   Family History  Problem Relation Age of Onset   Prostate cancer Father    Colon cancer Other    Hyperlipidemia Other    Kidney disease Other    Review of Systems  Constitutional: Negative.   HENT: Negative.    Eyes: Negative.   Respiratory:  Negative for cough, chest tightness and shortness of breath.   Cardiovascular:  Negative for chest pain, palpitations and leg swelling.  Gastrointestinal:  Negative for abdominal distention, abdominal pain, constipation, diarrhea, nausea and vomiting.  Musculoskeletal: Negative.   Skin: Negative.   Neurological: Negative.   Psychiatric/Behavioral: Negative.      Objective:  Physical Exam Constitutional:      Appearance: She is well-developed.  HENT:     Head: Normocephalic and atraumatic.  Cardiovascular:     Rate and Rhythm: Normal rate and regular rhythm.  Pulmonary:     Effort:  Pulmonary effort is normal. No respiratory distress.     Breath sounds: Normal breath sounds. No wheezing or rales.  Abdominal:     General: Bowel sounds are normal. There is no distension.     Palpations: Abdomen is soft.     Tenderness: There is no abdominal tenderness. There is no rebound.  Musculoskeletal:     Cervical back: Normal range of motion.  Skin:    General: Skin is warm and dry.  Neurological:     Mental Status: She is alert and oriented to person, place, and time.     Coordination: Coordination normal.     Vitals:   07/22/23 1536  BP: 136/84  Pulse: 60  Temp: 98.9 F (37.2 C)  TempSrc:  Temporal  SpO2: 95%  Weight: 103 lb 2 oz (46.8 kg)  Height: 5\' 1"  (1.549 m)    Assessment & Plan:

## 2023-07-24 NOTE — Assessment & Plan Note (Signed)
Flu shot yearly. Pneumonia complete. Shingrix complete. Tetanus due 2032. Colonoscopy due 2024. Mammogram due 2025, pap smear aged out and dexa due through gyn 2025 or 2026. Counseled about sun safety and mole surveillance. Counseled about the dangers of distracted driving. Given 10 year screening recommendations.

## 2023-07-24 NOTE — Assessment & Plan Note (Signed)
Referral to GI for colonoscopy as due this year.

## 2023-07-24 NOTE — Assessment & Plan Note (Signed)
Taking chlorthalidone 25 mg daily and checking CMP and adjust as needed. Continue.

## 2023-07-24 NOTE — Assessment & Plan Note (Signed)
Checking lipid panel and adjust crestor 5 mg daily as needed. 

## 2023-07-24 NOTE — Assessment & Plan Note (Signed)
Requip 1 mg at bedtime is working well and she has flexibility with 0.25 mg tablets to take as much as needed. Continue same dosing.

## 2023-07-30 ENCOUNTER — Ambulatory Visit
Admission: RE | Admit: 2023-07-30 | Discharge: 2023-07-30 | Disposition: A | Discharge status: Home / Self Care | Payer: Medicare PPO | Source: Ambulatory Visit | Attending: Internal Medicine | Admitting: Internal Medicine

## 2023-07-30 DIAGNOSIS — Z1231 Encounter for screening mammogram for malignant neoplasm of breast: Secondary | ICD-10-CM | POA: Diagnosis not present

## 2023-11-03 ENCOUNTER — Other Ambulatory Visit: Payer: Self-pay

## 2023-11-03 ENCOUNTER — Emergency Department (HOSPITAL_BASED_OUTPATIENT_CLINIC_OR_DEPARTMENT_OTHER)
Admission: EM | Admit: 2023-11-03 | Discharge: 2023-11-03 | Disposition: A | Discharge status: Home / Self Care | Payer: Medicare PPO | Attending: Emergency Medicine | Admitting: Emergency Medicine

## 2023-11-03 ENCOUNTER — Encounter (HOSPITAL_BASED_OUTPATIENT_CLINIC_OR_DEPARTMENT_OTHER): Payer: Self-pay | Admitting: Emergency Medicine

## 2023-11-03 DIAGNOSIS — Z23 Encounter for immunization: Secondary | ICD-10-CM | POA: Diagnosis not present

## 2023-11-03 DIAGNOSIS — S0181XA Laceration without foreign body of other part of head, initial encounter: Secondary | ICD-10-CM | POA: Insufficient documentation

## 2023-11-03 DIAGNOSIS — W01198A Fall on same level from slipping, tripping and stumbling with subsequent striking against other object, initial encounter: Secondary | ICD-10-CM | POA: Insufficient documentation

## 2023-11-03 DIAGNOSIS — S0993XA Unspecified injury of face, initial encounter: Secondary | ICD-10-CM | POA: Diagnosis present

## 2023-11-03 DIAGNOSIS — W19XXXA Unspecified fall, initial encounter: Secondary | ICD-10-CM

## 2023-11-03 MED ORDER — LIDOCAINE-EPINEPHRINE (PF) 2 %-1:200000 IJ SOLN
10.0000 mL | Freq: Once | INTRAMUSCULAR | Status: AC
  Administered 2023-11-03: 10 mL
  Filled 2023-11-03: qty 20

## 2023-11-03 MED ORDER — TETANUS-DIPHTH-ACELL PERTUSSIS 5-2.5-18.5 LF-MCG/0.5 IM SUSY
0.5000 mL | PREFILLED_SYRINGE | Freq: Once | INTRAMUSCULAR | Status: AC
  Administered 2023-11-03: 0.5 mL via INTRAMUSCULAR
  Filled 2023-11-03: qty 0.5

## 2023-11-03 NOTE — ED Provider Notes (Signed)
[place] EMERGENCY DEPARTMENT AT [facility] Provider Note   CSN: [id number] Arrival date & time: 11/03/23  1700     History  Chief Complaint  Patient presents with   Jasmine Pham is a 77 y.o. female with past medical history significant for hyperlipidemia, Mnire's disease, who does not take any blood thinners who presents with concern for fall today, laceration to chin.  Patient was walking in the woods and reports that she tripped over a root leading to her to fall and strike the chin.  Patient denies any loss of consciousness, nausea, vomiting, numbness or tingling of arms or legs.  She reports pain to the chin but denies any other injury.   Fall       Home Medications Prior to Admission medications   Medication Sig Start Date End Date Taking? Authorizing Provider  Calcium Carbonate-Vitamin D 600-400 MG-UNIT tablet Take 1 tablet by mouth daily.    [provider]  chlorthalidone (HYGROTON) 25 MG tablet Take 25 mg by mouth daily.    [provider]  [employer] ALLERGY RELIEF D 60-120 MG per tablet Take 1 tablet by mouth as needed. 03/07/15   [provider]  potassium chloride (K-DUR) 10 MEQ tablet Take 1 tablet (10 mEq total) by mouth daily. 07/01/12 11/27/21  Newt Lukes, MD  raloxifene (EVISTA) 60 MG tablet Take 60 mg by mouth daily.    [provider]  rOPINIRole (REQUIP) 0.25 MG tablet TAKE 4 TABLETS BY MOUTH EVERY DAY AT BEDTIME 02/11/23   Myrlene Broker, MD  rosuvastatin (CRESTOR) 5 MG tablet TAKE 1 TABLET BY MOUTH EVERY DAY 09/19/22   Myrlene Broker, MD      Allergies    Patient has no known allergies.    Review of Systems   Review of Systems  All other systems reviewed and are negative.   Physical Exam Updated Vital Signs BP (!) 165/84   Pulse 70   Temp 98 F (36.7 C)   Resp 18   Wt 49.9 kg   SpO2 99%   BMI 20.78 kg/m  Physical Exam Vitals and nursing note reviewed.   Constitutional:      General: She is not in acute distress.    Appearance: Normal appearance.  HENT:     Head: Normocephalic and atraumatic.  Eyes:     General:        Right eye: No discharge.        Left eye: No discharge.  Cardiovascular:     Rate and Rhythm: Normal rate and regular rhythm.     Heart sounds: No murmur heard.    No friction rub. No gallop.  Pulmonary:     Effort: Pulmonary effort is normal.     Breath sounds: Normal breath sounds.  Abdominal:     General: Bowel sounds are normal.     Palpations: Abdomen is soft.  Musculoskeletal:     Comments: Some tenderness to palpation of the chin surrounding the injury without step-off, deformity.  Patient reports that her teeth fit together appropriately, intact strength 5/5 bilateral upper and lower extremities, normal coordination throughout.  Skin:    General: Skin is warm and dry.     Capillary Refill: Capillary refill takes less than 2 seconds.     Comments: Approximately 2 to 3 cm laceration on the inferior aspect of the chin, no foreign body noted in wound, no significant active bleeding at this time.  Neurological:  Mental Status: She is alert and oriented to person, place, and time.     Comments: Moves all 4 limbs spontaneously, CN II through XII grossly intact, can ambulate without difficulty, intact sensation throughout.   Psychiatric:        Mood and Affect: Mood normal.        Behavior: Behavior normal.     ED Results / Procedures / Treatments   Labs (all labs ordered are listed, but only abnormal results are displayed) Labs Reviewed - No data to display  EKG None  Radiology No results found.  Procedures .Marland KitchenLaceration Repair  Date/Time: 11/03/2023 7:09 PM  Performed by: Olene Floss, PA-C Authorized by: Olene Floss, PA-C   Consent:    Consent obtained:  Verbal   Consent given by:  Patient   Risks, benefits, and alternatives were discussed: yes     Risks discussed:   Infection, need for additional repair, pain, nerve damage, poor wound healing and poor cosmetic result   Alternatives discussed:  No treatment Universal protocol:    Procedure explained and questions answered to patient or proxy's satisfaction: yes     Patient identity confirmed:  Verbally with patient Anesthesia:    Anesthesia method:  Local infiltration   Local anesthetic:  Lidocaine 2% WITH epi Laceration details:    Location:  Face   Face location:  Chin   Length (cm):  3   Depth (mm):  4 Treatment:    Area cleansed with:  Shur-Clens   Amount of cleaning:  Standard Skin repair:    Repair method:  Sutures   Suture size:  5-0   Suture material:  Prolene   Suture technique:  Simple interrupted   Number of sutures:  3 Approximation:    Approximation:  Close Repair type:    Repair type:  Simple Post-procedure details:    Dressing:  Open (no dressing)   Procedure completion:  Tolerated     Medications Ordered in ED Medications  Tdap (BOOSTRIX) injection 0.5 mL (0.5 mLs Intramuscular Given 11/03/23 1820)  lidocaine-EPINEPHrine (XYLOCAINE W/EPI) 2 %-1:200000 (PF) injection 10 mL (10 mLs Infiltration Given 11/03/23 1821)    ED Course/ Medical Decision Making/ A&P                                 Medical Decision Making  This patient is a 77 y.o. female who presents to the ED for concern of fall, laceration to chin.   Differential diagnoses prior to evaluation: epidural hematoma, subdural hematoma, skull fracture, subarachnoid hemorrhage, unstable cervical spine fracture, concussion vs other MSK injury, laceration requiring repair, fracture of mandible, versus other  Past Medical History / Social History / Additional history: Chart reviewed. Pertinent results include: Hyperlipidemia, Mnire's disease, anxiety, otherwise noncontributory  Physical Exam: Physical exam performed. The pertinent findings include: 2 to 3 cm laceration on the inferior aspect of the chin without  foreign body.  Not actively bleeding at time of initial evaluation.  She has no focal neurologic deficits.  Medications / Treatment: Discussed with patient that I would recommend a CT head and C-spine given her age and the nature of the fall, but she declines at this time.  Discussed the risks of declining CT including intracranial bleeding, and patient understands the risks, given that she is not having any focal neurologic deficits I think is reasonable for her to defer at this time although discussed that I think that  it would be smarter to do the CT anyways.  Patient continues to decline.  We will repair her chin.   Laceration repaired as described above, patient tolerated without difficulty.  Disposition: After consideration of the diagnostic results and the patients response to treatment, I feel that patient stable for discharge with plan as above.   emergency department workup does not suggest an emergent condition requiring admission or immediate intervention beyond what has been performed at this time. The plan is: as above. The patient is safe for discharge and has been instructed to return immediately for worsening symptoms, change in symptoms or any other concerns.  Final Clinical Impression(s) / ED Diagnoses Final diagnoses:  Chin laceration, initial encounter  Fall, initial encounter    Rx / DC Orders ED Discharge Orders     None         Olene Floss, PA-C 11/03/23 1909    Virgina Norfolk, DO 11/03/23 2153

## 2023-11-03 NOTE — Discharge Instructions (Signed)
You received sutures which are not dissolvable. Please return in 7-10 for suture removal. I would place a bandage on the affected area and change it at least once daily, or more frequently if it becomes soiled. When changing bandage, clean the wound thoroughly with soap and water, and place a thin layer of Polysporin or Neosporin directly over top of the wound before replacing bandage. Please monitor for any signs of developing infection including worsening redness, pain, pus draining from the affected site.  If you are concerned about any developing infection I recommend that you return for further evaluation for management.

## 2023-11-03 NOTE — ED Triage Notes (Signed)
Reports fall today , chin laceration . Tripped when was walking outdoor .  No blood thinner , no loc .

## 2023-11-13 ENCOUNTER — Encounter: Payer: Self-pay | Admitting: Internal Medicine

## 2023-11-13 ENCOUNTER — Ambulatory Visit: Payer: Medicare PPO | Admitting: Internal Medicine

## 2023-11-13 VITALS — BP 122/78 | HR 55 | Temp 97.8°F | Ht 61.0 in | Wt 106.0 lb

## 2023-11-13 DIAGNOSIS — R6884 Jaw pain: Secondary | ICD-10-CM | POA: Diagnosis not present

## 2023-11-13 NOTE — Assessment & Plan Note (Signed)
Chin laceration healed well and sutures removed today without complication tolerated well. No further treatment required and counseled about etiology of fall.

## 2023-11-13 NOTE — Progress Notes (Signed)
   Subjective:   Patient ID: Jasmine Pham, female    DOB: Jul 07, 1946, 77 y.o.   MRN: [id number]  Suture / Staple Removal   The patient is a 77 YO female coming in for ER follow up (fall and chin laceration with repair 3 sutures placed). She is recovering well. No signs of infection or redness or bleeding. No further falls.   Review of Systems  Constitutional: Negative.   HENT: Negative.    Eyes: Negative.   Respiratory:  Negative for cough, chest tightness and shortness of breath.   Cardiovascular:  Negative for chest pain, palpitations and leg swelling.  Gastrointestinal:  Negative for abdominal distention, abdominal pain, constipation, diarrhea, nausea and vomiting.  Musculoskeletal: Negative.   Skin: Negative.   Neurological: Negative.   Psychiatric/Behavioral: Negative.      Objective:  Physical Exam Constitutional:      Appearance: She is well-developed.  HENT:     Head: Normocephalic and atraumatic.  Cardiovascular:     Rate and Rhythm: Normal rate and regular rhythm.  Pulmonary:     Effort: Pulmonary effort is normal. No respiratory distress.     Breath sounds: Normal breath sounds. No wheezing or rales.  Abdominal:     General: Bowel sounds are normal. There is no distension.     Palpations: Abdomen is soft.     Tenderness: There is no abdominal tenderness. There is no rebound.  Musculoskeletal:     Cervical back: Normal range of motion.  Skin:    General: Skin is warm and dry.     Comments: Suture removal 3 sutures from chin without complication tolerated well  Neurological:     Mental Status: She is alert and oriented to person, place, and time.     Coordination: Coordination normal.     Vitals:   11/13/23 0858  BP: 122/78  Pulse: (!) 55  Temp: 97.8 F (36.6 C)  TempSrc: Oral  SpO2: 98%  Weight: 106 lb (48.1 kg)  Height: 5\' 1"  (1.549 m)    Assessment & Plan:  Visit time 15 minutes in face to face communication with patient and coordination of  care, additional 5 minutes spent in record review, coordination or care, ordering tests, communicating/referring to other healthcare professionals, documenting in medical records all on the same day of the visit for total time 20 minutes spent on the visit.

## 2023-12-24 ENCOUNTER — Telehealth: Payer: Self-pay | Admitting: Gastroenterology

## 2023-12-24 NOTE — Telephone Encounter (Signed)
 Good morning Dr. Charlanne,   Supervising Provider 1/14 AM    We received a call from this patient wishing to schedule an appointment for a colonoscopy due to history of polyps. Patient last had colonoscopy in 07/2018 with Dr. Luis at [facility]. Since then Dr. Luis has retired and patient would like to schedule with Van. Records were obtained and scanned into Media for you to review. Would you please advise on scheduling?  Thank you.

## 2023-12-31 NOTE — Telephone Encounter (Signed)
Proceed directly with colonoscopy for previous history of polyps as recommended by Dr. Kinnie Scales.  Any provider-first available slot Colonoscopy reports under procedure tab. RG

## 2024-01-14 NOTE — Telephone Encounter (Signed)
Left voicemail for patient to call back and schedule appointment.

## 2024-01-16 ENCOUNTER — Encounter: Payer: Self-pay | Admitting: Gastroenterology

## 2024-02-25 DIAGNOSIS — D3131 Benign neoplasm of right choroid: Secondary | ICD-10-CM | POA: Diagnosis not present

## 2024-02-25 DIAGNOSIS — H35372 Puckering of macula, left eye: Secondary | ICD-10-CM | POA: Diagnosis not present

## 2024-02-25 DIAGNOSIS — D3192 Benign neoplasm of unspecified part of left eye: Secondary | ICD-10-CM | POA: Diagnosis not present

## 2024-02-25 DIAGNOSIS — Z961 Presence of intraocular lens: Secondary | ICD-10-CM | POA: Diagnosis not present

## 2024-03-02 ENCOUNTER — Other Ambulatory Visit: Payer: Self-pay | Admitting: Internal Medicine

## 2024-03-10 DIAGNOSIS — Z85828 Personal history of other malignant neoplasm of skin: Secondary | ICD-10-CM | POA: Diagnosis not present

## 2024-03-10 DIAGNOSIS — D225 Melanocytic nevi of trunk: Secondary | ICD-10-CM | POA: Diagnosis not present

## 2024-03-10 DIAGNOSIS — D2272 Melanocytic nevi of left lower limb, including hip: Secondary | ICD-10-CM | POA: Diagnosis not present

## 2024-03-10 DIAGNOSIS — L738 Other specified follicular disorders: Secondary | ICD-10-CM | POA: Diagnosis not present

## 2024-03-10 DIAGNOSIS — L309 Dermatitis, unspecified: Secondary | ICD-10-CM | POA: Diagnosis not present

## 2024-03-24 ENCOUNTER — Encounter: Payer: Medicare PPO | Admitting: Gastroenterology

## 2024-03-30 ENCOUNTER — Ambulatory Visit (AMBULATORY_SURGERY_CENTER): Admitting: *Deleted

## 2024-03-30 VITALS — Ht 61.0 in | Wt 108.0 lb

## 2024-03-30 DIAGNOSIS — Z860100 Personal history of colon polyps, unspecified: Secondary | ICD-10-CM

## 2024-03-30 NOTE — Progress Notes (Signed)
 Pre visit completed over telephone. Instructions forwarded through MyChart.  Patient states she is able to navigate MyChart without issues.     No egg or soy allergy known to patient  No issues known to pt with past sedation with any surgeries or procedures Patient denies ever being told they had issues or difficulty with intubation  No FH of Malignant Hyperthermia Pt is not on diet pills Pt is not on  home 02  Pt is not on blood thinners  Pt denies issues with constipation  No A fib or A flutter Have any cardiac testing pending-- NO Pt instructed to use Singlecare.com or GoodRx for a price reduction on prep

## 2024-04-15 ENCOUNTER — Encounter: Payer: Self-pay | Admitting: Gastroenterology

## 2024-04-24 ENCOUNTER — Other Ambulatory Visit: Payer: Self-pay | Admitting: Internal Medicine

## 2024-04-28 ENCOUNTER — Ambulatory Visit (AMBULATORY_SURGERY_CENTER): Admitting: Gastroenterology

## 2024-04-28 ENCOUNTER — Encounter: Payer: Self-pay | Admitting: Gastroenterology

## 2024-04-28 VITALS — BP 139/61 | HR 64 | Temp 98.2°F | Resp 13 | Ht 61.0 in | Wt 108.0 lb

## 2024-04-28 DIAGNOSIS — Z860100 Personal history of colon polyps, unspecified: Secondary | ICD-10-CM | POA: Diagnosis not present

## 2024-04-28 DIAGNOSIS — K5730 Diverticulosis of large intestine without perforation or abscess without bleeding: Secondary | ICD-10-CM | POA: Diagnosis not present

## 2024-04-28 DIAGNOSIS — Z1211 Encounter for screening for malignant neoplasm of colon: Secondary | ICD-10-CM

## 2024-04-28 DIAGNOSIS — K640 First degree hemorrhoids: Secondary | ICD-10-CM | POA: Diagnosis not present

## 2024-04-28 DIAGNOSIS — F419 Anxiety disorder, unspecified: Secondary | ICD-10-CM | POA: Diagnosis not present

## 2024-04-28 DIAGNOSIS — E785 Hyperlipidemia, unspecified: Secondary | ICD-10-CM | POA: Diagnosis not present

## 2024-04-28 MED ORDER — SODIUM CHLORIDE 0.9 % IV SOLN
500.0000 mL | INTRAVENOUS | Status: DC
Start: 2024-04-28 — End: 2024-04-28

## 2024-04-28 NOTE — Patient Instructions (Signed)
 Resume previous diet Continue present medications Repeat colonoscopy is not recommended for screening purposes. Only needed if any new problems develop. Return to GI clinic as needed See handouts on diverticulosis and hemorrhoids YOU HAD AN ENDOSCOPIC PROCEDURE TODAY AT THE [facility]:   Refer to the procedure report that was given to you for any specific questions about what was found during the examination.  If the procedure report does not answer your questions, please call your gastroenterologist to clarify.  If you requested that your care partner not be given the details of your procedure findings, then the procedure report has been included in a sealed envelope for you to review at your convenience later.  YOU SHOULD EXPECT: Some feelings of bloating in the abdomen. Passage of more gas than usual.  Walking can help get rid of the air that was put into your GI tract during the procedure and reduce the bloating. If you had a lower endoscopy (such as a colonoscopy or flexible sigmoidoscopy) you may notice spotting of blood in your stool or on the toilet paper. If you underwent a bowel prep for your procedure, you may not have a normal bowel movement for a few days.  Please Note:  You might notice some irritation and congestion in your nose or some drainage.  This is from the oxygen used during your procedure.  There is no need for concern and it should clear up in a day or so.  SYMPTOMS TO REPORT IMMEDIATELY:  Following lower endoscopy (colonoscopy or flexible sigmoidoscopy):  Excessive amounts of blood in the stool  Significant tenderness or worsening of abdominal pains  Swelling of the abdomen that is new, acute  Fever of 100F or higher  For urgent or emergent issues, a gastroenterologist can be reached at any hour by calling [phone]. Do not use MyChart messaging for urgent concerns.   DIET:  We do recommend a small meal at first, but then you may proceed to your  regular diet.  Drink plenty of fluids but you should avoid alcoholic beverages for 24 hours.  ACTIVITY:  You should plan to take it easy for the rest of today and you should NOT DRIVE or use heavy machinery until tomorrow (because of the sedation medicines used during the test).    FOLLOW UP: Our staff will call the number listed on your records the next business day following your procedure.  We will call around 7:15- 8:00 am to check on you and address any questions or concerns that you may have regarding the information given to you following your procedure. If we do not reach you, we will leave a message.     If any biopsies were taken you will be contacted by phone or by letter within the next 1-3 weeks.  Please call us  at [phone] if you have not heard about the biopsies in 3 weeks.   SIGNATURES/CONFIDENTIALITY: You and/or your care partner have signed paperwork which will be entered into your electronic medical record.  These signatures attest to the fact that that the information above on your After Visit Summary has been reviewed and is understood.  Full responsibility of the confidentiality of this discharge information lies with you and/or your care-partner.

## 2024-04-28 NOTE — Progress Notes (Signed)
 Sedate, gd SR, tolerated procedure well, VSS, report to RN

## 2024-04-28 NOTE — Progress Notes (Signed)
 Patient states there have been no changes to medical or surgical history since time of pre-visit.

## 2024-04-28 NOTE — Op Note (Signed)
 [facility] Patient Name: Jasmine Pham Procedure Date: 04/28/2024 2:03 PM MRN: [id number] Endoscopist: Lajuan Pila , MD, [id number] Age: 78 Referring MD:  Date of Birth: 09-19-1946 Gender: Female Account #: [id number] Procedure:                Colonoscopy Indications:              High risk colon cancer surveillance: Personal                            history of colonic polyps. Neg colonoscopy 2019. Medicines:                Monitored Anesthesia Care Procedure:                Pre-Anesthesia Assessment:                           - Prior to the procedure, a History and Physical                            was performed, and patient medications and                            allergies were reviewed. The patient's tolerance of                            previous anesthesia was also reviewed. The risks                            and benefits of the procedure and the sedation                            options and risks were discussed with the patient.                            All questions were answered, and informed consent                            was obtained. Prior Anticoagulants: The patient has                            taken no anticoagulant or antiplatelet agents. ASA                            Grade Assessment: II - A patient with mild systemic                            disease. After reviewing the risks and benefits,                            the patient was deemed in satisfactory condition to                            undergo the procedure.  After obtaining informed consent, the colonoscope                            was passed under direct vision. Throughout the                            procedure, the patient's blood pressure, pulse, and                            oxygen saturations were monitored continuously. The                            PCF-HQ190L Colonoscope [id number] was introduced                            through the anus  and advanced to the the cecum,                            identified by appendiceal orifice and ileocecal                            valve. The colonoscopy was performed without                            difficulty. The patient tolerated the procedure                            well. The quality of the bowel preparation was                            good. The ileocecal valve, appendiceal orifice, and                            rectum were photographed. Scope In: 2:18:04 PM Scope Out: 2:29:17 PM Scope Withdrawal Time: 0 hours 8 minutes 5 seconds  Total Procedure Duration: 0 hours 11 minutes 13 seconds  Findings:                 Multiple medium-mouthed diverticula were found in                            the sigmoid colon, few in descending colon and rare                            in ascending colon.                           Non-bleeding internal hemorrhoids were found during                            retroflexion. The hemorrhoids were small and Grade                            I (internal hemorrhoids that do not prolapse).  The exam was otherwise without abnormality on                            direct and retroflexion views. Complications:            No immediate complications. Estimated Blood Loss:     Estimated blood loss: none. Impression:               - Moderate predominantly sigmoid diverticulosis.                           - Non-bleeding internal hemorrhoids.                           - The examination was otherwise normal on direct                            and retroflexion views.                           - No specimens collected. Recommendation:           - Patient has a contact number available for                            emergencies. The signs and symptoms of potential                            delayed complications were discussed with the                            patient. Return to normal activities tomorrow.                             Written discharge instructions were provided to the                            patient.                           - Resume previous diet.                           - Continue present medications.                           - Repeat colonoscopy is not recommended for                            screening purposes. Hence, repeat colonoscopy only                            if any new problems.                           - Return to GI clinic PRN. Lajuan Pila, MD 04/28/2024 2:34:53 PM This report has been signed electronically.

## 2024-04-28 NOTE — Progress Notes (Signed)
 [facility] History and Physical   Primary Care Physician:  Adelia Homestead, MD   Reason for Procedure:   H/O Polyps.  last colonoscopy by Dr. Andriette Keeling 07/2018.  repeat in 5 years  Plan:     colonoscopy     HPI: Jasmine Pham is a 78 y.o. female    Past Medical History:  Diagnosis Date   Allergic rhinitis    Anxiety    Finger fracture, left    ring   Hyperlipidemia    Meniere's disease, unspecified ear    Personal history of colonic polyps     Past Surgical History:  Procedure Laterality Date   CATARACT EXTRACTION W/ INTRAOCULAR LENS IMPLANT Bilateral 2019   COLONOSCOPY  07/2018   TONSILLECTOMY  1956    Prior to Admission medications   Medication Sig Start Date End Date Taking? Authorizing Provider  Calcium  Carbonate-Vitamin D 600-400 MG-UNIT tablet Take 1 tablet by mouth daily.   Yes [provider]  chlorthalidone (HYGROTON) 25 MG tablet Take 25 mg by mouth daily.   Yes [provider]  [facility] ALLERGY RELIEF D 60-120 MG per tablet Take 1 tablet by mouth as needed. 03/07/15  Yes [provider]  Polyethylene Glycol 3350 (MIRALAX PO) Take 14 Doses/Fill by mouth once. Colonoscopy prep take as directed   Yes [provider]  potassium chloride  (K-DUR) 10 MEQ tablet Take 1 tablet (10 mEq total) by mouth daily. 07/01/12 04/28/24 Yes Carolene Chute, MD  raloxifene (EVISTA) 60 MG tablet Take 60 mg by mouth daily.   Yes [provider]  rOPINIRole  (REQUIP ) 0.25 MG tablet TAKE 4 TABLETS BY MOUTH EVERY DAY AT BEDTIME 04/24/24  Yes Adelia Homestead, MD  rosuvastatin  (CRESTOR ) 5 MG tablet TAKE 1 TABLET BY MOUTH EVERY DAY 03/03/24  Yes Adelia Homestead, MD  FLUAD 0.5 ML injection  11/05/23   [provider]  triamcinolone cream (KENALOG) 0.1 % SMARTSIG:1 sparingly Topical Twice Daily Patient not taking: Reported on 04/28/2024 03/10/24   [provider]    Current Outpatient Medications   Medication Sig Dispense Refill   Calcium  Carbonate-Vitamin D 600-400 MG-UNIT tablet Take 1 tablet by mouth daily.     chlorthalidone (HYGROTON) 25 MG tablet Take 25 mg by mouth daily.     [facility] ALLERGY RELIEF D 60-120 MG per tablet Take 1 tablet by mouth as needed.  0   Polyethylene Glycol 3350 (MIRALAX PO) Take 14 Doses/Fill by mouth once. Colonoscopy prep take as directed     potassium chloride  (K-DUR) 10 MEQ tablet Take 1 tablet (10 mEq total) by mouth daily. 90 tablet 1   raloxifene (EVISTA) 60 MG tablet Take 60 mg by mouth daily.     rOPINIRole  (REQUIP ) 0.25 MG tablet TAKE 4 TABLETS BY MOUTH EVERY DAY AT BEDTIME 90 tablet 1   rosuvastatin  (CRESTOR ) 5 MG tablet TAKE 1 TABLET BY MOUTH EVERY DAY 90 tablet 1   FLUAD 0.5 ML injection      triamcinolone cream (KENALOG) 0.1 % SMARTSIG:1 sparingly Topical Twice Daily (Patient not taking: Reported on 04/28/2024)     Current Facility-Administered Medications  Medication Dose Route Frequency Provider Last Rate Last Admin   0.9 %  sodium chloride infusion  500 mL Intravenous Continuous Lajuan Pila, MD        Allergies as of 04/28/2024   (No Known Allergies)    Family History  Problem Relation Age of Onset   Prostate cancer Father    Colon cancer  Brother    Colon cancer Other    Hyperlipidemia Other    Kidney disease Other    Colon polyps Neg Hx    Esophageal cancer Neg Hx    Stomach cancer Neg Hx    Rectal cancer Neg Hx     Social History   Socioeconomic History   Marital status: Married    Spouse name: Not on file   Number of children: Not on file   Years of education: Not on file   Highest education level: Bachelor's degree (e.g., BA, AB, BS)  Occupational History   Not on file  Tobacco Use   Smoking status: Never   Smokeless tobacco: Never  Vaping Use   Vaping status: Never Used  Substance and Sexual Activity   Alcohol use: Yes    Comment: occasional   Drug use: Never   Sexual activity: Not on file  Other Topics  Concern   Not on file  Social History Narrative   Not on file   Social Drivers of Health   Financial Resource Strain: Low Risk  (11/12/2023)   Overall Financial Resource Strain (CARDIA)    Difficulty of Paying Living Expenses: Not hard at all  Food Insecurity: No Food Insecurity (11/12/2023)   Hunger Vital Sign    Worried About Running Out of Food in the Last Year: Never true    Ran Out of Food in the Last Year: Never true  Transportation Needs: No Transportation Needs (11/12/2023)   PRAPARE - Administrator, Civil Service (Medical): No    Lack of Transportation (Non-Medical): No  Physical Activity: Unknown (11/12/2023)   Exercise Vital Sign    Days of Exercise per Week: 3 days    Minutes of Exercise per Session: Not on file  Stress: No Stress Concern Present (11/12/2023)   [employer] of Occupational Health - Occupational Stress Questionnaire    Feeling of Stress : Not at all  Social Connections: Moderately Isolated (11/12/2023)   Social Connection and Isolation Panel [NHANES]    Frequency of Communication with Friends and Family: More than three times a week    Frequency of Social Gatherings with Friends and Family: Once a week    Attends Religious Services: Never    Database administrator or Organizations: No    Attends Engineer, structural: Not on file    Marital Status: Married  Catering manager Violence: Not At Risk (04/24/2022)   Humiliation, Afraid, Rape, and Kick questionnaire    Fear of Current or Ex-Partner: No    Emotionally Abused: No    Physically Abused: No    Sexually Abused: No    Review of Systems: Positive for none All other review of systems negative except as mentioned in the HPI.  Physical Exam: Vital signs in last 24 hours: @VSRANGES @   General:   Alert,  Well-developed, well-nourished, pleasant and cooperative in NAD Lungs:  Clear throughout to auscultation.   Heart:  Regular rate and rhythm; no murmurs, clicks, rubs,  or  gallops. Abdomen:  Soft, nontender and nondistended. Normal bowel sounds.   Neuro/Psych:  Alert and cooperative. Normal mood and affect. A and O x 3    No significant changes were identified.  The patient continues to be an appropriate candidate for the planned procedure and anesthesia.   Magnus Schuller, MD. [facility] 04/28/2024 2:09 PM@

## 2024-04-29 ENCOUNTER — Telehealth: Payer: Self-pay

## 2024-04-29 NOTE — Telephone Encounter (Signed)
 No answer after follow up call. Voice message left.

## 2024-07-07 ENCOUNTER — Other Ambulatory Visit: Payer: Self-pay | Admitting: Internal Medicine

## 2024-07-07 NOTE — Telephone Encounter (Unsigned)
 Copied from CRM [id number]. Topic: Clinical - Medication Refill >> Jul 07, 2024  8:57 AM Berneda FALCON wrote: Medication:  rOPINIRole  (REQUIP ) 0.25 MG tablet  Has the patient contacted their pharmacy? Yes (Agent: If no, request that the patient contact the pharmacy for the refill. If patient does not wish to contact the pharmacy document the reason why and proceed with request.) (Agent: If yes, when and what did the pharmacy advise?)  This is the patient's preferred pharmacy:  [id number] - [place], [place] - [address] AT [address] [place] [address] Phone: [phone] Fax: [phone]  Is this the correct pharmacy for this prescription? Yes If no, delete pharmacy and type the correct one.   Has the prescription been filled recently? No  Is the patient out of the medication? No, but only has 2 days left  Has the patient been seen for an appointment in the last year OR does the patient have an upcoming appointment? Yes  Can we respond through MyChart? Yes  Agent: Please be advised that Rx refills may take up to 3 business days. We ask that you follow-up with your pharmacy.

## 2024-07-08 MED ORDER — ROPINIROLE HCL 0.25 MG PO TABS: 1.0000 mg | ORAL_TABLET | Freq: Every day | ORAL | 3 refills | Status: AC

## 2024-07-08 NOTE — Telephone Encounter (Signed)
 Copied from CRM [id number]. Topic: Clinical - Prescription Issue >> Jul 08, 2024 11:41 AM Macario HERO wrote: Reason for CRM: Patient is calling regarding medication refill for rOPINIRole  (REQUIP ) 0.25 MG tablet. States she will take her last dose today and will not have any for tomorrow night when she needs it to sleep. - Advised patient of 3 business day timeframe.

## 2024-07-14 DIAGNOSIS — N958 Other specified menopausal and perimenopausal disorders: Secondary | ICD-10-CM | POA: Diagnosis not present

## 2024-07-14 DIAGNOSIS — M816 Localized osteoporosis [Lequesne]: Secondary | ICD-10-CM | POA: Diagnosis not present

## 2024-07-27 ENCOUNTER — Ambulatory Visit (INDEPENDENT_AMBULATORY_CARE_PROVIDER_SITE_OTHER): Admitting: Internal Medicine

## 2024-07-27 ENCOUNTER — Encounter: Payer: Self-pay | Admitting: Internal Medicine

## 2024-07-27 VITALS — BP 120/82 | HR 65 | Temp 98.0°F | Ht 61.0 in | Wt 106.0 lb

## 2024-07-27 DIAGNOSIS — H8102 Meniere's disease, left ear: Secondary | ICD-10-CM

## 2024-07-27 DIAGNOSIS — M810 Age-related osteoporosis without current pathological fracture: Secondary | ICD-10-CM

## 2024-07-27 DIAGNOSIS — Z0000 Encounter for general adult medical examination without abnormal findings: Secondary | ICD-10-CM | POA: Diagnosis not present

## 2024-07-27 DIAGNOSIS — E782 Mixed hyperlipidemia: Secondary | ICD-10-CM

## 2024-07-27 DIAGNOSIS — G2581 Restless legs syndrome: Secondary | ICD-10-CM

## 2024-07-27 LAB — LIPID PANEL
Cholesterol: 225 mg/dL — ABNORMAL HIGH (ref 0–200)
HDL: 77.5 mg/dL (ref >39.00)
LDL Cholesterol: 121 mg/dL — ABNORMAL HIGH (ref 0–99)
NonHDL: 147.7
Total CHOL/HDL Ratio: 3
Triglycerides: 136 mg/dL (ref 0.0–149.0)
VLDL: 27.2 mg/dL (ref 0.0–40.0)

## 2024-07-27 LAB — COMPREHENSIVE METABOLIC PANEL WITH GFR
ALT: 11 U/L (ref 0–35)
AST: 19 U/L (ref 0–37)
Albumin: 4 g/dL (ref 3.5–5.2)
Alkaline Phosphatase: 84 U/L (ref 39–117)
BUN: 11 mg/dL (ref 6–23)
CO2: 33 meq/L — ABNORMAL HIGH (ref 19–32)
Calcium: 9 mg/dL (ref 8.4–10.5)
Chloride: 103 meq/L (ref 96–112)
Creatinine, Ser: 0.64 mg/dL (ref 0.40–1.20)
GFR: 85.94 mL/min (ref >60.00)
Glucose, Bld: 83 mg/dL (ref 70–99)
Potassium: 4.4 meq/L (ref 3.5–5.1)
Sodium: 142 meq/L (ref 135–145)
Total Bilirubin: 1 mg/dL (ref 0.2–1.2)
Total Protein: 6.2 g/dL (ref 6.0–8.3)

## 2024-07-27 LAB — CBC
HCT: 41.4 % (ref 36.0–46.0)
Hemoglobin: 14.1 g/dL (ref 12.0–15.0)
MCHC: 33.9 g/dL (ref 30.0–36.0)
MCV: 89.9 fl (ref 78.0–100.0)
Platelets: 241 K/uL (ref 150.0–400.0)
RBC: 4.61 Mil/uL (ref 3.87–5.11)
RDW: 13.4 % (ref 11.5–15.5)
WBC: 4.8 K/uL (ref 4.0–10.5)

## 2024-07-27 NOTE — Progress Notes (Signed)
 Subjective:   Patient ID: Jasmine Pham, female    DOB: 07-13-1946, 78 y.o.   MRN: [id number]  The patient is here for physical. Pertinent topics discussed: Discussed the use of AI scribe software for clinical note transcription with the patient, who gave verbal consent to proceed.  History of Present Illness Jasmine Pham is a 78 year old female who presents for a routine follow-up visit.  She recently had a bone density test and reports that it looked about the same as it had been. She is on Evista for bone health and is due for a vitamin D  level check soon.  Here for medicare wellness and physical, no new complaints. Please see A/P for status and treatment of chronic medical problems.   Diet: heart healthy  Physical activity: sedentary, walks some Depression/mood screen: negative Hearing: intact to whispered voice Visual acuity: grossly normal, performs annual eye exam  ADLs: capable Fall risk: none Home safety: good Cognitive evaluation: intact to orientation, naming, recall and repetition EOL planning: adv directives discussed  Flowsheet Row Office Visit from 07/27/2024 in [facility] [place]  PHQ-2 Total Score 0    Flowsheet Row Clinical Support from 04/24/2022 in [facility] [place]  PHQ-9 Total Score 0      04/23/2022    3:41 PM 04/24/2022    9:52 AM 07/24/2023    8:03 AM 11/13/2023    8:58 AM 07/27/2024    4:03 PM  Fall Risk  Falls in the past year? 0 0 0 1 0  Was there an injury with Fall? 0 0  1 0  Fall Risk Category Calculator 0 0  2 0  Fall Risk Category (Retired) Low  Low      (RETIRED) Patient Fall Risk Level  Low fall risk      Patient at Risk for Falls Due to  No Fall Risks  History of fall(s)   Fall risk Follow up  Falls evaluation completed   Falls evaluation completed Falls evaluation completed     Data saved with a previous flowsheet row definition    I have personally reviewed and have  noted 1. The patient's medical and social history - reviewed today no changes 2. Their use of alcohol, tobacco or illicit drugs 3. Their current medications and supplements 4. The patient's functional ability including ADL's, fall risks, home safety risks and hearing or visual impairment. 5. Diet and physical activities 6. Evidence for depression or mood disorders 7. Care team reviewed and updated 8.  The patient is not on an opioid pain medication.  Patient Care Team: Rollene Almarie LABOR, MD as PCP - General (Internal Medicine) Rosalynn LELON Ingle, MD (Inactive) (Obstetrics and Gynecology) Past Medical History:  Diagnosis Date   Allergic rhinitis    Anxiety    Finger fracture, left    ring   Hyperlipidemia    Meniere's disease, unspecified ear    Personal history of colonic polyps    Past Surgical History:  Procedure Laterality Date   CATARACT EXTRACTION W/ INTRAOCULAR LENS IMPLANT Bilateral 2019   COLONOSCOPY  07/2018   TONSILLECTOMY  1956   Family History  Problem Relation Age of Onset   Prostate cancer Father    Colon cancer Brother    Colon cancer Other    Hyperlipidemia Other    Kidney disease Other    Colon polyps Neg Hx    Esophageal cancer Neg Hx    Stomach cancer  Neg Hx    Rectal cancer Neg Hx     PMH, FMH, social history reviewed and updated  Review of Systems  Constitutional: Negative.   HENT: Negative.    Eyes: Negative.   Respiratory:  Negative for cough, chest tightness and shortness of breath.   Cardiovascular:  Negative for chest pain, palpitations and leg swelling.  Gastrointestinal:  Negative for abdominal distention, abdominal pain, constipation, diarrhea, nausea and vomiting.  Musculoskeletal: Negative.   Skin: Negative.   Neurological: Negative.   Psychiatric/Behavioral: Negative.      Objective:  Physical Exam Constitutional:      Appearance: She is well-developed.  HENT:     Head: Normocephalic and atraumatic.  Cardiovascular:      Rate and Rhythm: Normal rate and regular rhythm.  Pulmonary:     Effort: Pulmonary effort is normal. No respiratory distress.     Breath sounds: Normal breath sounds. No wheezing or rales.  Abdominal:     General: Bowel sounds are normal. There is no distension.     Palpations: Abdomen is soft.     Tenderness: There is no abdominal tenderness. There is no rebound.  Musculoskeletal:     Cervical back: Normal range of motion.  Skin:    General: Skin is warm and dry.  Neurological:     Mental Status: She is alert and oriented to person, place, and time.     Coordination: Coordination normal.     Vitals:   07/27/24 1557  BP: 120/82  Pulse: 65  Temp: 98 F (36.7 C)  TempSrc: Oral  SpO2: 97%  Weight: 106 lb (48.1 kg)  Height: 5' 1 (1.549 m)   Assessment & Plan:

## 2024-07-28 ENCOUNTER — Other Ambulatory Visit: Payer: Self-pay | Admitting: Internal Medicine

## 2024-07-28 DIAGNOSIS — Z1231 Encounter for screening mammogram for malignant neoplasm of breast: Secondary | ICD-10-CM

## 2024-07-28 LAB — VITAMIN D 25 HYDROXY (VIT D DEFICIENCY, FRACTURES): VITD: 17.96 ng/mL — ABNORMAL LOW (ref 30.00–100.00)

## 2024-07-29 ENCOUNTER — Ambulatory Visit: Payer: Self-pay | Admitting: Internal Medicine

## 2024-07-29 DIAGNOSIS — M810 Age-related osteoporosis without current pathological fracture: Secondary | ICD-10-CM | POA: Insufficient documentation

## 2024-07-29 NOTE — Assessment & Plan Note (Addendum)
 Stable overall continue to monitor. Taking chlorthalidone and checking CMP.

## 2024-07-29 NOTE — Assessment & Plan Note (Signed)
 Checking lipid panel and adjust as needed crestor  5 mg daily.

## 2024-07-29 NOTE — Assessment & Plan Note (Signed)
Flu shot yearly. Pneumonia complete. Shingrix complete. Tetanus up to date. Colonoscopy up to date. Mammogram up to date, pap smear aged out and dexa up to date. Counseled about sun safety and mole surveillance. Counseled about the dangers of distracted driving. Given 10 year screening recommendations.   

## 2024-07-29 NOTE — Assessment & Plan Note (Signed)
 Using requip  4 mg at bedtime and doing well. Refill when needed.

## 2024-07-29 NOTE — Assessment & Plan Note (Signed)
 Taking evista and recent DEXA with gyn we will obtain copy.

## 2024-08-11 ENCOUNTER — Ambulatory Visit
Admission: RE | Admit: 2024-08-11 | Discharge: 2024-08-11 | Disposition: A | Discharge status: Home / Self Care | Source: Ambulatory Visit | Attending: Internal Medicine | Admitting: Internal Medicine

## 2024-08-11 DIAGNOSIS — Z1231 Encounter for screening mammogram for malignant neoplasm of breast: Secondary | ICD-10-CM | POA: Diagnosis not present

## 2024-08-17 ENCOUNTER — Ambulatory Visit: Payer: Self-pay | Admitting: Internal Medicine

## 2024-09-21 ENCOUNTER — Other Ambulatory Visit: Payer: Self-pay

## 2024-09-21 ENCOUNTER — Emergency Department (HOSPITAL_BASED_OUTPATIENT_CLINIC_OR_DEPARTMENT_OTHER)
Admission: EM | Admit: 2024-09-21 | Discharge: 2024-09-21 | Discharge status: Against Medical Advice | Attending: Emergency Medicine | Admitting: Emergency Medicine

## 2024-09-21 DIAGNOSIS — X58XXXA Exposure to other specified factors, initial encounter: Secondary | ICD-10-CM | POA: Diagnosis not present

## 2024-09-21 DIAGNOSIS — S61411A Laceration without foreign body of right hand, initial encounter: Secondary | ICD-10-CM | POA: Diagnosis not present

## 2024-09-21 DIAGNOSIS — S6991XA Unspecified injury of right wrist, hand and finger(s), initial encounter: Secondary | ICD-10-CM | POA: Diagnosis present

## 2024-09-21 DIAGNOSIS — W548XXA Other contact with dog, initial encounter: Secondary | ICD-10-CM | POA: Insufficient documentation

## 2024-09-21 DIAGNOSIS — Z5321 Procedure and treatment not carried out due to patient leaving prior to being seen by health care provider: Secondary | ICD-10-CM | POA: Insufficient documentation

## 2024-09-21 NOTE — ED Triage Notes (Signed)
 Small lac to R hand from own dog. Unsure on TDAP status. Bleeding controlled.

## 2024-10-23 ENCOUNTER — Other Ambulatory Visit: Payer: Self-pay | Admitting: Pharmacist

## 2024-10-23 DIAGNOSIS — E782 Mixed hyperlipidemia: Secondary | ICD-10-CM

## 2024-10-23 MED ORDER — ROSUVASTATIN CALCIUM 5 MG PO TABS: 5.0000 mg | ORAL_TABLET | Freq: Every day | ORAL | 1 refills | Status: AC

## 2024-10-23 NOTE — Progress Notes (Signed)
 Pharmacy Quality Measure Review  This patient is appearing on a report for being at risk of failing the adherence measure for cholesterol (statin) medications this calendar year.   Medication: rosuvastatin  Last fill date: 05/31/24 for 90 day supply  Pt overdue for rosuvastatin  refill. Has no refills remaining. Last saw PCP August 2025. Sent refill of rosuvastatin  to pt's pharmacy.  Darrelyn Drum, PharmD, BCPS, CPP Clinical Pharmacist Practitioner [facility] [phone]

## 2024-12-08 ENCOUNTER — Encounter: Payer: Self-pay | Admitting: Internal Medicine

## 2024-12-08 ENCOUNTER — Ambulatory Visit (INDEPENDENT_AMBULATORY_CARE_PROVIDER_SITE_OTHER): Admitting: Internal Medicine

## 2024-12-08 VITALS — BP 122/70 | HR 65 | Temp 98.0°F | Ht 61.0 in | Wt 102.0 lb

## 2024-12-08 DIAGNOSIS — H66002 Acute suppurative otitis media without spontaneous rupture of ear drum, left ear: Secondary | ICD-10-CM

## 2024-12-08 MED ORDER — AMOXICILLIN-POT CLAVULANATE 875-125 MG PO TABS: 1.0000 | ORAL_TABLET | Freq: Two times a day (BID) | ORAL | 0 refills | Status: AC

## 2024-12-08 NOTE — Progress Notes (Signed)
 "  Subjective:   Patient ID: Jasmine Pham, female    DOB: 20-Feb-1946, 78 y.o.   MRN: [id number]  Discussed the use of AI scribe software for clinical note transcription with the patient, who gave verbal consent to proceed.  History of Present Illness Jasmine Pham is a 78 year old female who presents with sinus and ear pain.  She has been experiencing symptoms for the past week, beginning with a back headache that progressed to sinus headaches. There is significant pressure in her head, particularly on the side, which started on Christmas night. She has been mostly bedridden over the weekend to rest and recover.  She notes soreness in her upper jaw, which has occurred before, making it painful to chew and yawn. This jaw pain started on the Monday before Christmas. She also experienced a severe earache on the left side, described as the worst she has ever had, beginning last night. Her left ear felt like it was going to 'pop'.  She has been taking over-the-counter medications for sinus issues and used tramadol, prescribed a year ago for oral surgery, to manage the pain. It took about an hour for the tramadol to have any effect. Today, her ear is still sore but not as painful as yesterday  She reports green mucus on the left side of her face and a persistent cough, which she attributes to drainage. No fever, chills, muscle aches, or body aches. She continues to experience some headache. Her breathing is normal, and she has not been in contact with others who are sick.   Review of Systems  Constitutional:  Positive for activity change. Negative for appetite change, chills, fatigue, fever and unexpected weight change.  HENT:  Positive for congestion, ear pain, postnasal drip, rhinorrhea and sinus pressure. Negative for ear discharge, sinus pain, sneezing, sore throat, tinnitus, trouble swallowing and voice change.   Eyes: Negative.   Respiratory:  Positive for cough. Negative for chest  tightness, shortness of breath and wheezing.   Cardiovascular: Negative.   Gastrointestinal: Negative.   Musculoskeletal:  Positive for myalgias.  Neurological: Negative.     Objective:  Physical Exam Constitutional:      Appearance: She is well-developed.  HENT:     Head: Normocephalic and atraumatic.     Comments: Oropharynx with redness and clear drainage, nose with swollen turbinates, TMs normal bilaterally.     Right Ear: Tympanic membrane normal.     Ears:     Comments: Left TM with bulging yellow fluid no rupture with redness in canal and around TM Neck:     Thyroid: No thyromegaly.  Cardiovascular:     Rate and Rhythm: Normal rate and regular rhythm.  Pulmonary:     Effort: Pulmonary effort is normal. No respiratory distress.     Breath sounds: Normal breath sounds. No wheezing or rales.  Abdominal:     Palpations: Abdomen is soft.  Musculoskeletal:        General: Tenderness present.     Cervical back: Normal range of motion.  Lymphadenopathy:     Cervical: No cervical adenopathy.  Skin:    General: Skin is warm and dry.  Neurological:     Mental Status: She is alert and oriented to person, place, and time.     Vitals:   12/08/24 1506  BP: 122/70  Pulse: 65  Temp: 98 F (36.7 C)  TempSrc: Temporal  SpO2: 97%  Weight: 102 lb (46.3 kg)  Height: 5' 1 (1.549 m)  Assessment and Plan Assessment & Plan Acute otitis media and acute sinusitis   Symptoms and examination indicate a bacterial infection left ear. Prescribed Augmentin for 7 days, twice daily. Recommend Sudafed for sinus drainage and Tylenol or ibuprofen for pain. Advise 500 mg Tylenol and 200 mg ibuprofen twice daily for pain control.   "

## 2024-12-08 NOTE — Patient Instructions (Addendum)
 We have sent in augmentin to take 1 pill twice a day for 1 week. e
